# Patient Record
Sex: Female | Born: 1935 | Race: White | Hispanic: No | Smoking: Former smoker
Health system: Southern US, Community
[De-identification: ages and names within clinical notes are randomized; demographics above are authoritative.]

## PROBLEM LIST (undated history)

## (undated) DIAGNOSIS — R4182 Altered mental status, unspecified: Secondary | ICD-10-CM

## (undated) DIAGNOSIS — J440 Chronic obstructive pulmonary disease with acute lower respiratory infection: Secondary | ICD-10-CM

## (undated) DIAGNOSIS — M069 Rheumatoid arthritis, unspecified: Secondary | ICD-10-CM

## (undated) DIAGNOSIS — I5189 Other ill-defined heart diseases: Secondary | ICD-10-CM

## (undated) DIAGNOSIS — I2510 Atherosclerotic heart disease of native coronary artery without angina pectoris: Secondary | ICD-10-CM

## (undated) DIAGNOSIS — M810 Age-related osteoporosis without current pathological fracture: Secondary | ICD-10-CM

## (undated) DIAGNOSIS — J449 Chronic obstructive pulmonary disease, unspecified: Secondary | ICD-10-CM

## (undated) DIAGNOSIS — J209 Acute bronchitis, unspecified: Secondary | ICD-10-CM

## (undated) DIAGNOSIS — E559 Vitamin D deficiency, unspecified: Secondary | ICD-10-CM

## (undated) DIAGNOSIS — J439 Emphysema, unspecified: Secondary | ICD-10-CM

## (undated) DIAGNOSIS — E785 Hyperlipidemia, unspecified: Secondary | ICD-10-CM

## (undated) DIAGNOSIS — E039 Hypothyroidism, unspecified: Secondary | ICD-10-CM

## (undated) HISTORY — DX: Hyperlipidemia, unspecified: E78.5

## (undated) HISTORY — DX: Chronic obstructive pulmonary disease, unspecified: J44.9

## (undated) HISTORY — DX: Other ill-defined heart diseases: I51.89

## (undated) HISTORY — DX: Vitamin D deficiency, unspecified: E55.9

## (undated) HISTORY — DX: Atherosclerotic heart disease of native coronary artery without angina pectoris: I25.10

## (undated) HISTORY — DX: Altered mental status, unspecified: R41.82

## (undated) HISTORY — DX: Rheumatoid arthritis, unspecified: M06.9

## (undated) HISTORY — DX: Age-related osteoporosis without current pathological fracture: M81.0

## (undated) HISTORY — DX: Hypothyroidism, unspecified: E03.9

---

## 1984-06-21 HISTORY — PX: ABDOMINAL HYSTERECTOMY: SHX81

## 1999-07-23 ENCOUNTER — Encounter: Admission: RE | Admit: 1999-07-23 | Discharge: 1999-07-23 | Payer: Self-pay | Admitting: Family Medicine

## 1999-07-23 ENCOUNTER — Encounter: Payer: Self-pay | Admitting: Family Medicine

## 2000-08-25 ENCOUNTER — Encounter: Payer: Self-pay | Admitting: Family Medicine

## 2000-08-25 ENCOUNTER — Encounter: Admission: RE | Admit: 2000-08-25 | Discharge: 2000-08-25 | Payer: Self-pay | Admitting: Family Medicine

## 2002-05-06 ENCOUNTER — Encounter: Payer: Self-pay | Admitting: Family Medicine

## 2002-05-06 ENCOUNTER — Encounter: Admission: RE | Admit: 2002-05-06 | Discharge: 2002-05-06 | Payer: Self-pay | Admitting: Family Medicine

## 2002-05-10 ENCOUNTER — Encounter: Admission: RE | Admit: 2002-05-10 | Discharge: 2002-05-10 | Payer: Self-pay | Admitting: Family Medicine

## 2002-05-10 ENCOUNTER — Encounter: Payer: Self-pay | Admitting: Family Medicine

## 2004-02-01 ENCOUNTER — Encounter: Admission: RE | Admit: 2004-02-01 | Discharge: 2004-02-01 | Payer: Self-pay | Admitting: Rheumatology

## 2004-06-01 ENCOUNTER — Emergency Department (HOSPITAL_COMMUNITY): Admission: EM | Admit: 2004-06-01 | Discharge: 2004-06-01 | Payer: Self-pay | Admitting: Emergency Medicine

## 2005-08-30 ENCOUNTER — Encounter: Admission: RE | Admit: 2005-08-30 | Discharge: 2005-08-30 | Payer: Self-pay | Admitting: Rheumatology

## 2005-09-16 ENCOUNTER — Encounter: Admission: RE | Admit: 2005-09-16 | Discharge: 2005-09-16 | Payer: Self-pay | Admitting: Rheumatology

## 2005-09-26 ENCOUNTER — Encounter: Admission: RE | Admit: 2005-09-26 | Discharge: 2005-09-26 | Payer: Self-pay | Admitting: Rheumatology

## 2006-01-01 ENCOUNTER — Encounter: Admission: RE | Admit: 2006-01-01 | Discharge: 2006-01-01 | Payer: Self-pay | Admitting: Thoracic Surgery

## 2006-07-08 ENCOUNTER — Encounter: Admission: RE | Admit: 2006-07-08 | Discharge: 2006-07-08 | Payer: Self-pay | Admitting: Thoracic Surgery

## 2006-07-10 ENCOUNTER — Ambulatory Visit: Payer: Self-pay | Admitting: Thoracic Surgery

## 2006-11-19 ENCOUNTER — Encounter (HOSPITAL_COMMUNITY): Admission: RE | Admit: 2006-11-19 | Discharge: 2007-01-21 | Payer: Self-pay | Admitting: Rheumatology

## 2007-06-22 ENCOUNTER — Encounter (HOSPITAL_COMMUNITY): Admission: RE | Admit: 2007-06-22 | Discharge: 2007-09-20 | Payer: Self-pay | Admitting: Rheumatology

## 2008-01-30 IMAGING — CR DG CHEST 2V
2 series · 2 of 2 positions shown · non-contrast
Comparison: [REDACTED] chest x-ray 06/01/04.

CLINICAL DATA: Cough, congestion, smoker. 
DIAGNOSTIC CHEST - 2 VIEW:

[w chest pa]
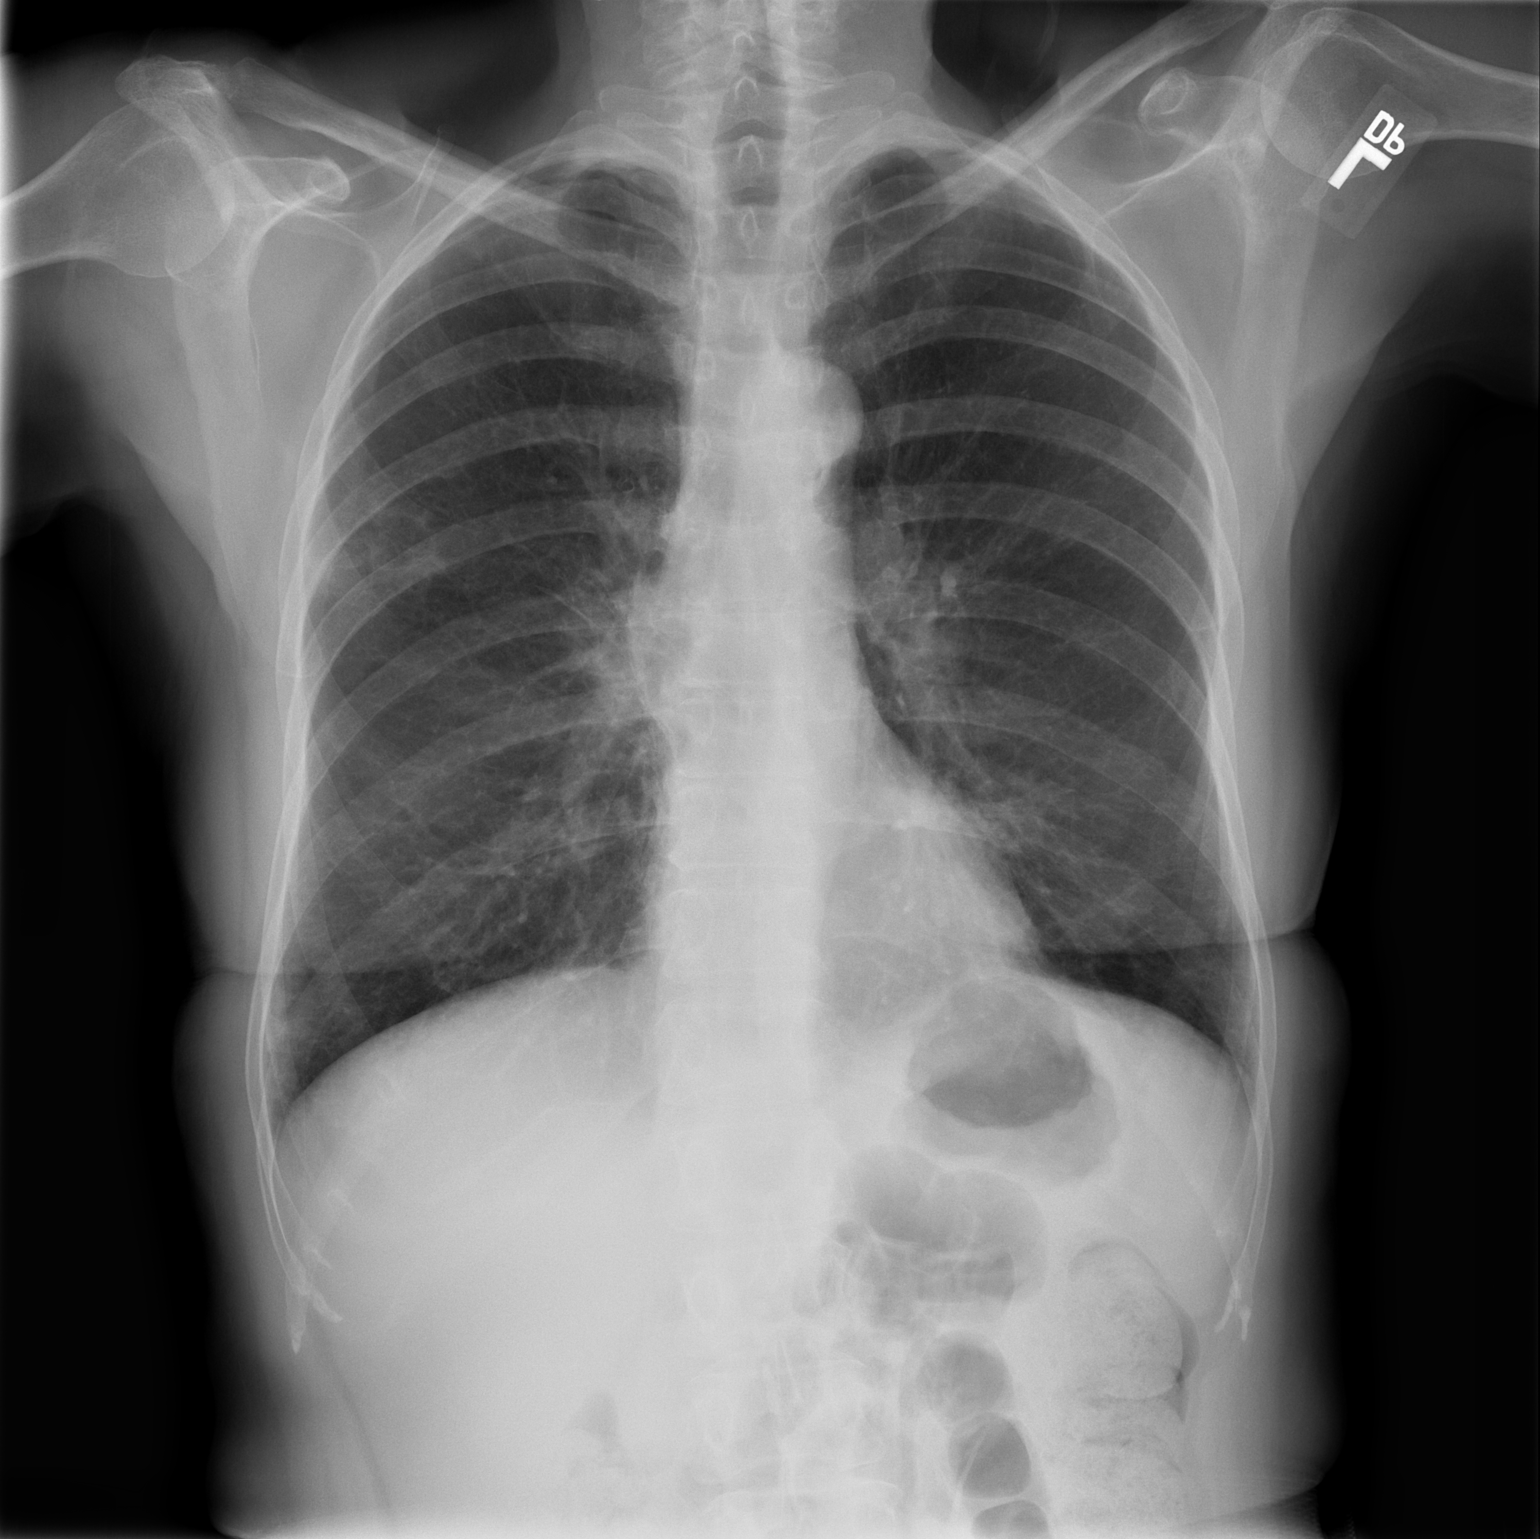

[w chest lat]
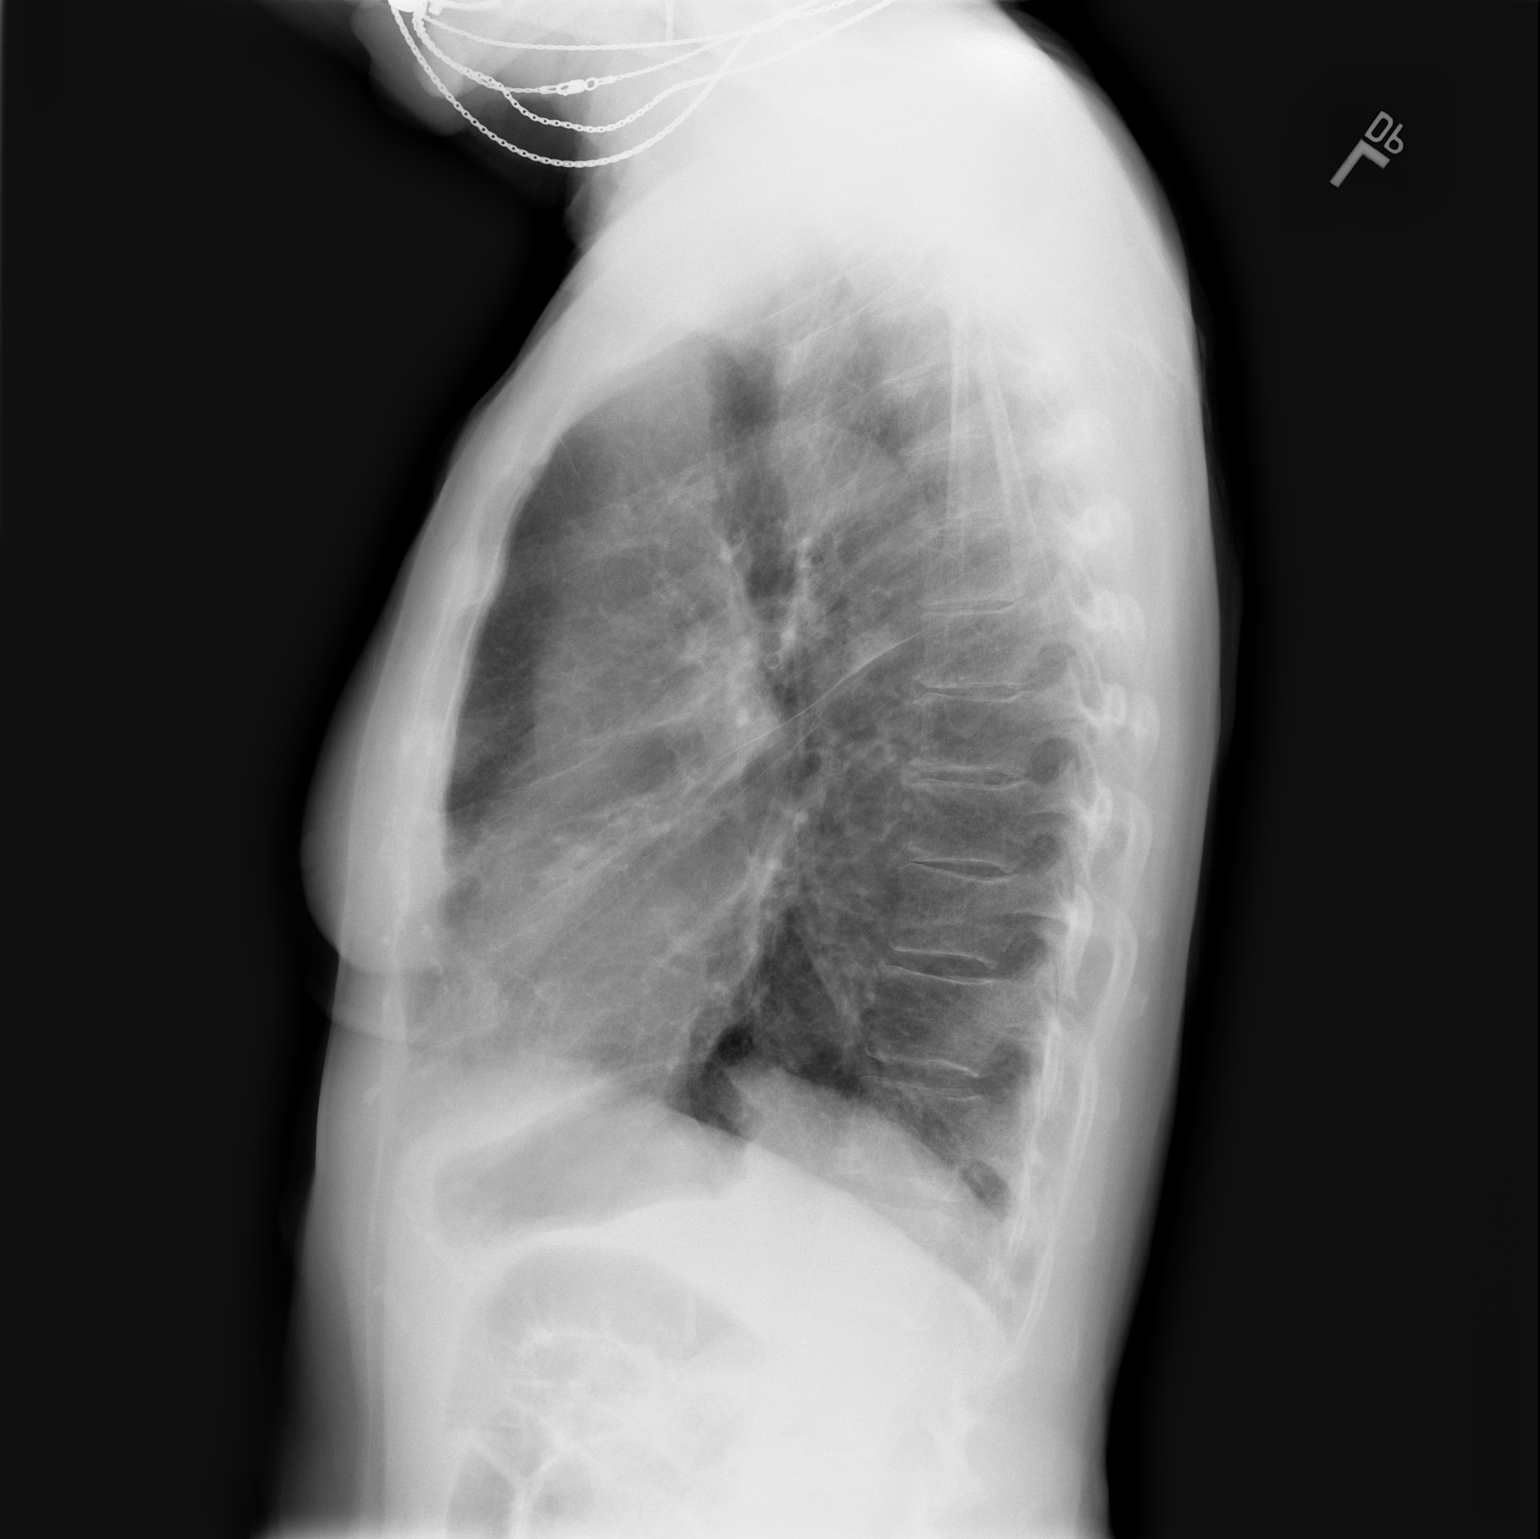

[2 of 2 positions shown; findings below may reference images not displayed]

FINDINGS: Since prior study patchy interstitial opacity is seen at the right upper lobe with progressive diffuse prominence of the interstitium. Either patchy interstitial infiltrate or slight fullness is seen at the lateral right hilum since prior study.  Followup chest x-ray to clearing and/or chest CT with intravenous contrast are advised for further evaluation of this finding.  Remainder of study is stable with normal heart size and the lungs otherwise clear.
IMPRESSION: 1.  Diffuse bronchitis with minimal patchy interstitial pneumonitis right upper lobe. 
2.  Either faint perihilar interstitial infiltrate versus fullness right hilum since prior study ? recommend followup chest x-ray to clearing and/or chest CT for further evaluation. 
3.  Otherwise no significant abnormality.

## 2009-10-11 ENCOUNTER — Encounter: Admission: RE | Admit: 2009-10-11 | Discharge: 2009-10-11 | Payer: Self-pay | Admitting: Family Medicine

## 2010-02-11 ENCOUNTER — Encounter: Payer: Self-pay | Admitting: Thoracic Surgery

## 2010-02-11 ENCOUNTER — Encounter: Payer: Self-pay | Admitting: Rheumatology

## 2010-06-05 NOTE — Letter (Signed)
July 10, 2006   Donia Guiles, M.D.  [address] [place], [place] [address]   Re:  LAYAL, JAVID                DOB:   Dear Dr. Arvilla Market:   I saw the patient back today and her CT scan showed clearing of all of  her air space disease, and just some scarring in the left lingula.  For  this reason, I feel that this was all inflammatory secondary to smoking.  She, fortunately, now has quit smoking and hopefully her lung status  will stabilize. Because of her long history of smoking, I do recommend  that she get at least a chest x-ray once a year. I will be happy to see  her back if she has any further pulmonary problems.   Ines Bloomer, M.D.  Electronically Signed   DPB/MEDQ  D:  07/10/2006  T:  07/11/2006  Job:  [id number]

## 2011-05-03 ENCOUNTER — Other Ambulatory Visit: Payer: Self-pay | Admitting: Advanced Practice Midwife

## 2011-05-03 ENCOUNTER — Ambulatory Visit
Admission: RE | Admit: 2011-05-03 | Discharge: 2011-05-03 | Disposition: A | Discharge status: Home / Self Care | Payer: BC Managed Care – PPO | Source: Ambulatory Visit | Attending: Family Medicine | Admitting: Family Medicine

## 2011-05-03 ENCOUNTER — Other Ambulatory Visit: Payer: Self-pay | Admitting: Family Medicine

## 2011-05-03 DIAGNOSIS — R0600 Dyspnea, unspecified: Secondary | ICD-10-CM

## 2012-02-07 ENCOUNTER — Institutional Professional Consult (permissible substitution): Payer: BC Managed Care – PPO | Admitting: Internal Medicine

## 2012-02-25 ENCOUNTER — Encounter: Payer: Self-pay | Admitting: Internal Medicine

## 2012-02-26 ENCOUNTER — Ambulatory Visit (INDEPENDENT_AMBULATORY_CARE_PROVIDER_SITE_OTHER): Payer: Medicare PPO | Admitting: Internal Medicine

## 2012-02-26 ENCOUNTER — Encounter: Payer: Self-pay | Admitting: Internal Medicine

## 2012-02-26 ENCOUNTER — Ambulatory Visit (INDEPENDENT_AMBULATORY_CARE_PROVIDER_SITE_OTHER)
Admission: RE | Admit: 2012-02-26 | Discharge: 2012-02-26 | Disposition: A | Discharge status: Home / Self Care | Payer: Medicare PPO | Source: Ambulatory Visit | Attending: Internal Medicine | Admitting: Internal Medicine

## 2012-02-26 VITALS — BP 110/72 | HR 65 | Temp 97.8°F | Ht 65.0 in | Wt 171.6 lb

## 2012-02-26 DIAGNOSIS — J8410 Pulmonary fibrosis, unspecified: Secondary | ICD-10-CM

## 2012-02-26 NOTE — Assessment & Plan Note (Addendum)
-   02/26/2012  Walked RA x 3 laps @ 185 ft each stopped due to end of study, no desat     DDx for pulmonary fibrosis  includes idiopathic pulmonary fibrosis, pulmonary fibrosis associated with rheumatologic diseases (like RA, which she apparently has but which have a relatively benign course in most cases) , adverse effect from  drugs such as chemotherapy (like mtx, which she is taking chronically)  or amiodarone exposure, nonspecific interstitial pneumonia which is typically steroid responsive, and chronic hypersensitivity pneumonitis.   In active  smokers Langerhan's Cell  Histiocyctosis (eosinophilic granuomatosis),  DIP,  and Respiratory Bronchiolitis ILD also need to be considered,    In her case the main concern would be mild RA lung or MTX toxicity but neither appears relevant at present but clearly needs f/u with perhaps noct oximetry on RA if there is a noct concern.  See instructions for specific recommendations which were reviewed directly with the patient who was given a copy with highlighter outlining the key components.

## 2012-02-26 NOTE — Progress Notes (Signed)
  Subjective:    Patient ID: Tina Bishop, female    DOB: April 29, 1932  MRN: [id number]  HPI  77 yowf quit smoking 2008 p dx slt emphysema with no chronic symptoms then referred 02/26/2012 to pulmonary clinic by Dr Philipp Deputy for copd eval   02/26/2012 1st pulmonary ov/ Clive Parcel on advair x 2 years denies any limting sob with desired activities but grand daughter [name] wanted her eval for sob as apparently had a spell with some confusion sev weeks prior to OV  But pt denies any knowledge of this and drove herself to office s fm members to corroborate the hx.  Has RA on mtx x years with ? PF on cxr 05/03/11.  No obvious daytime variabilty or assoc chronic cough or cp or chest tightness, subjective wheeze overt sinus or hb symptoms. No unusual exp hx or h/o childhood pna/ asthma or premature birth to her knowledge.   Pt reports Sleeping ok without nocturnal  or early am exacerbation  of respiratory  c/o's or need for noct saba. Also denies any obvious fluctuation of symptoms with weather or environmental changes or other aggravating or alleviating factors except as outlined above   Review of Systems  Constitutional: Negative for fever and unexpected weight change.  HENT: Positive for congestion and rhinorrhea. Negative for ear pain, nosebleeds, sore throat, sneezing, trouble swallowing, dental problem, postnasal drip and sinus pressure.   Eyes: Negative for redness and itching.  Respiratory: Negative for cough, chest tightness, shortness of breath and wheezing.   Cardiovascular: Negative for palpitations and leg swelling.  Gastrointestinal: Negative for nausea and vomiting.  Genitourinary: Negative for dysuria.  Musculoskeletal: Negative for joint swelling.  Skin: Positive for rash.  Neurological: Negative for headaches.  Hematological: Does not bruise/bleed easily.  Psychiatric/Behavioral: Negative for dysphoric mood. The patient is not nervous/anxious.        Objective:   Physical Exam Wt  Readings from Last 3 Encounters:  02/26/12 171 lb 9.6 oz (77.837 kg)   HEENT:full set of dentures, nl turbinates, and orophanx. Nl external ear canals without cough reflex   NECK :  without JVD/Nodes/TM/ nl carotid upstrokes bilaterally   LUNGS: no acc muscle use, clear to A and P bilaterally without cough on insp or exp maneuvers - no significant crackles appreciated nor broncho-alveolar changes   CV:  RRR  no s3 or murmur or increase in P2, no edema   ABD:  soft and nontender with nl excursion in the supine position. No bruits or organomegaly, bowel sounds nl  MS:  warm without deformities, calf tenderness, cyanosis or clubbing  SKIN: warm and dry without lesions    NEURO:  alert, approp, no deficits    cxr 05/03/11  Prominent interstitial markings at the lung bases. Possible  progressive pulmonary fibrosis with interstitial edema less likely.  Mild peribronchial thickening consistent with bronchitis as well   CXR  02/26/2012 :  1. Chronic change with hyperaeration and foci of linear scarring. 2. Nodular opacity anteriorly on the lateral view of the retrosternal air space. This is not definitely seen previously and CT of the chest may be helpful to assess further if warranted.           Assessment & Plan:

## 2012-02-26 NOTE — Patient Instructions (Addendum)
Try off advair (I already understand you're not using it consistently anyway)  Please remember to go to the  x-ray department downstairs for your tests - we will call you with the results when they are available.     Please schedule a follow up office visit in 6 weeks, call sooner if needed with PFTs and bring any family members with you who are concerned about your breathing so we can all talk together about your problem

## 2012-04-08 ENCOUNTER — Ambulatory Visit: Payer: Medicare PPO | Admitting: Internal Medicine

## 2012-05-13 ENCOUNTER — Encounter: Payer: Self-pay | Admitting: Internal Medicine

## 2012-05-13 ENCOUNTER — Ambulatory Visit (INDEPENDENT_AMBULATORY_CARE_PROVIDER_SITE_OTHER): Payer: Medicare PPO | Admitting: Internal Medicine

## 2012-05-13 VITALS — BP 106/60 | HR 71 | Temp 97.0°F | Ht 62.0 in | Wt 162.0 lb

## 2012-05-13 DIAGNOSIS — J8410 Pulmonary fibrosis, unspecified: Secondary | ICD-10-CM

## 2012-05-13 LAB — PULMONARY FUNCTION TEST

## 2012-05-13 NOTE — Progress Notes (Signed)
Subjective:    Patient ID: Tina Bishop, female    DOB: 12-18-1932  MRN: [id number]  HPI  76 yowf quit smoking 2008 p dx slt emphysema with no chronic symptoms then referred 02/26/2012 to pulmonary clinic by Dr Philipp Deputy for copd eval   02/26/2012 1st pulmonary ov/ Wert on advair x 2 years denies any limting sob with desired activities but grand daughter Tina Bishop wanted her eval for sob as apparently had a spell with some confusion sev weeks prior to OV  But pt denies any knowledge of this and drove herself to office s fm members to corroborate the hx.  Has RA on mtx x years with ? PF on cxr 05/03/11.  05/13/2012 f/u ov/Wert re ? COPD / pf Chief Complaint  Patient presents with  . Follow-up    Pt states breathing is doing well, her nasal congestion is better since the last visit.    able to yard work, not limited at all with sob or using any resp meds  No obvious daytime variabilty or assoc chronic cough or cp or chest tightness, subjective wheeze overt sinus or hb symptoms. No unusual exp hx or h/o childhood pna/ asthma or premature birth to her knowledge.   Pt reports Sleeping ok without nocturnal  or early am exacerbation  of respiratory  c/o's or need for noct saba. Also denies any obvious fluctuation of symptoms with weather or environmental changes or other aggravating or alleviating factors except as outlined above   Current Medications, Allergies, Past Medical History, Past Surgical History, Family History, and Social History were reviewed in [employer] record.  ROS  The following are not active complaints unless bolded sore throat, dysphagia, dental problems, itching, sneezing,  nasal congestion or excess/ purulent secretions, ear ache,   fever, chills, sweats, unintended wt loss, pleuritic or exertional cp, hemoptysis,  orthopnea pnd or leg swelling, presyncope, palpitations, heartburn, abdominal pain, anorexia, nausea, vomiting, diarrhea  or change in bowel  or urinary habits, change in stools or urine, dysuria,hematuria,  rash, arthralgias, visual complaints, headache, numbness weakness or ataxia or problems with walking or coordination,  change in mood/affect or memory.            Objective:   Physical Exam  Wt Readings from Last 3 Encounters:  05/13/12 162 lb (73.483 kg)  02/26/12 171 lb 9.6 oz (77.837 kg)     HEENT:full set of dentures, nl turbinates, and orophanx. Nl external ear canals without cough reflex   NECK :  without JVD/Nodes/TM/ nl carotid upstrokes bilaterally   LUNGS: no acc muscle use, clear to A and P bilaterally without cough on insp or exp maneuvers - no significant crackles appreciated nor broncho-alveolar changes   CV:  RRR  no s3 or murmur or increase in P2, no edema   ABD:  soft and nontender with nl excursion in the supine position. No bruits or organomegaly, bowel sounds nl  MS:  warm without deformities, calf tenderness, cyanosis or clubbing       cxr 05/03/11  Prominent interstitial markings at the lung bases. Possible  progressive pulmonary fibrosis with interstitial edema less likely.  Mild peribronchial thickening consistent with bronchitis as well   CXR  02/26/2012 :  1. Chronic change with hyperaeration and foci of linear scarring. 2. Nodular opacity anteriorly on the lateral view of the retrosternal air space. This is not definitely seen previously and CT of the chest may be helpful to assess further if warranted.  Assessment & Plan:

## 2012-05-13 NOTE — Patient Instructions (Addendum)
You do not have sigificant lung disease from smoking and therefore it is very unlikely you ever will as long as you don't smoke  Your rheumatism and use of methotrexate may cause lung problems over time but as long as you do not notice decrease exercise tolerance you do not need follow up here unless your rheumatologist wants you screened for this (yearly at most is all I would recommend if no symptoms)

## 2012-05-13 NOTE — Assessment & Plan Note (Signed)
-   02/26/2012  Walked RA x 3 laps @ 185 ft each stopped due to end of study, no desat   - 05/13/2012 PFT's VC 2.35 and no obst and DLCO 47 corrects to 94%  No significant copd or ILD though she is at risk of the latter from RA and MTX.  I had an extended summary discussion with the patient today lasting 15 to 20 minutes of a 25 minute visit on the following issues:   Does not have sign copd so likely never will (today's pft's reviewed with her)  Risk of ILD so reasonable to do walking sats and pft's yearly but we can certainly see her sooner if needed for resp symptoms

## 2012-05-13 NOTE — Progress Notes (Signed)
PFT done today. 

## 2012-06-05 ENCOUNTER — Encounter: Payer: Self-pay | Admitting: Internal Medicine

## 2012-08-03 ENCOUNTER — Ambulatory Visit: Payer: Medicare PPO | Admitting: Neurology

## 2012-08-10 ENCOUNTER — Ambulatory Visit (INDEPENDENT_AMBULATORY_CARE_PROVIDER_SITE_OTHER): Payer: Medicare PPO | Admitting: Neurology

## 2012-08-10 ENCOUNTER — Encounter: Payer: Self-pay | Admitting: Neurology

## 2012-08-10 VITALS — BP 130/71 | HR 75 | Ht 63.0 in | Wt 164.0 lb

## 2012-08-10 DIAGNOSIS — G3184 Mild cognitive impairment, so stated: Secondary | ICD-10-CM

## 2012-08-10 DIAGNOSIS — R569 Unspecified convulsions: Secondary | ICD-10-CM | POA: Insufficient documentation

## 2012-08-10 NOTE — Patient Instructions (Addendum)
Overall you are doing fairly well but I do want to suggest a few things today:   As far as your medications are concerned, I would like to suggest holding off on re-starting you on any anti-epileptic medications pending the results of your EEG. You do not wish to be on any medication for your memory and we can hold off on starting treatment for that at this time.  As far as diagnostic testing: We will order a routine EEG and will contact you to schedule this.  I would like to see you back in 3 to 4 months, sooner if we need to. Please call us with any interim questions, concerns, problems, updates or refill requests.   Please also call us for any test results so we can go over those with you on the phone.  My clinical assistant and will answer any of your questions and relay your messages to me and also relay most of my messages to you.   Our phone number is [phone]. We also have an after hours call service for urgent matters and there is a physician on-call for urgent questions. For any emergencies you know to call 911 or go to the nearest emergency room

## 2012-08-10 NOTE — Progress Notes (Signed)
HPI:  Tina Bishop is a 80y/o woman presenting for initial evaluation of possible seizure disorder and cognitive decline. Has been followed by Dr Okey Dupre (neurologist) at [facility] but requested a new neurologist. Has not seen her in over 1.53yrs. Patient currently states she is doing well and reports she is doing well. States she has not had any seizures in the past. Is difficult to get accurate history from patient.  Onset of Episodes: Reports having had 2 episodes. First one was 2.5years ago. She reports feeling disoriented/confused, per patient her family reports she was not her self, she did not recognize them, she reports no abnormal movements, no facial automatisms. She thinks it lasted a few minutes. She attributes this to a UTI. 8 months later had a 2nd episode where she states she was unable to walk, was not confused during this episode. States her oxygen was low during this episode.She was started on keppra 500mg  bid after the first episode. She stopped the keppra 2 months ago after discussion with Dr Clelia Croft. No hx of seizures in the past. No history of head trauma. No headaches. No hx of meningitis or encephalitis. No EtOH use. Continues to drive.    Imaging/EEG: Reports having brain imaging in the past but unsure what it showed but reports it was normal.  Memory decline: Lives alone, states memory is good. Takes care of her finances, no difficulties. Was started on Namenda at the same time as keppra, recently stopped namenda when she stopped keppra. She feels her memory is better off of the Namenda. No hallucinations. Sleeps well, no RBD. Remains active.   Last EEG completed on 04/2009 which showed "mild slow wave abnormality in both frontal and temporal regions indicating a slight neurophysiologic disturbance w/in both frontal and temporal areas, R>L"   ROS: Constitutional: Denies fever, +weight gain Eyes: Denies blurry vision, loss of vision, eye pain CV: Denies chest pain,  palpitations, syncope Pulm: Denies SOB, dyspnea, cough GI:  Denies constipation, diarrhea, abdominal pain MSK: Denies spasms, muscle pain, weakness Neuro: Denies HA, vertigo, falls, tremor Psyc:  Denies depression, hallucinatons, confusion Hem/lymph: Denies easy bleeding, bruising, no swollen nodes Allergic: No runny nose, hives, rashes  All other ROS are negative      Exam: Gen: NAD, pleasant CV: RRR no m/r/g Pulm: CTA bilat Abd: +BS, soft, NT, ND  Neuro: Tina: alert, oriented to name, 08/10/2012, building and city. Follows simple and mulitstep commands. Tells POTUS x 1, forgets GW Bush but remembers [place] and [employer]. Difficulty with serial 7s(gets 93 and 86 but nothing further. Calculates 6 quarters in 1.50. 3/3 immediate and 2/3 recall at 5 minutes. CN: PERRL, EOMI no nystagmus, no ptosis, sensation intact to LT V1-V3 bilat, face symmetric, no weakness, hearing grossly intact, palate elevates symmetrically, shoulder shrug 5/5 bilat,  tongue protrudes midline, no fasiculations noted.  Motor: normal bulk and tone Strength: 5/5  In all extremities  Coord: rapid alternating and point-to-point (FNF, HTS) movements intact.  Reflexes: symmetrical, bilat downgoing toes  Sens: LT intact in all extremities  Gait: posture, stance, stride and arm-swing normal. Tandem gait intact. Able to walk on heels and toes. Romberg absent.    Assessment/Plan:  To Dr. Clelia Croft,  Thank you for the opportunity to take part in the care of her patient Tina. Tina Bishop. As you know Tina. Pavelko is a pleasant 79 year old woman who reports for initial evaluation of questionable seizure episodes and cognitive decline. She reports to clinic today without family and states  she is unsure why she is here. Patientt is overall poor historian. Per chart review this event initially current 2008. Had normal brain MRI at that time and an EEG showing nonspecific frontal temporal slowing. Patient continues to  drive and recently stopped Keppra and Namenda with no further episodes. Per the records the episode lasted around 2 hours and involved confusion disorientation with loss of bladder control. No loss of consciousness or generalized tonic-clonic movements. No lip smacking or eye  closure noted. Per records the patient was noted by her family to be talking gibberish during this event.  1) Seizure disorder  Per records and history these episodes seem concerning for seizure disorder. EEG was nonspecific but does show possible areas of foci. Patient does continue to drive and is now taking Keppra at this time. As patient appears to have had multiple events she is at a higher risk of having further seizure episodes.   -will recheck EEG to compare to prior studies -patient states she does not wish to start back on any AEDs. As it has been >32yrs (per patient) since an event it is prudent to hold off on re-starting AED pending repeat EEG. Based on results would then make decision on continued need for AED -patient counseled on driving risk, states she has follow up with [facility] scheduled for later this summer, counseled to avoid swimming alone   2)Mild cognitive impairment  Based on basic mental status exam patient appears to have some cognitive decline but overall appears to be functioning well. At this time she is adamant that she does not have any cognitive issues and does not wish to proceed with further testing. Without further testing cannot determine underlying etiology at this point.   -if willing, in the future would check B12, TSH and MOCA or MMSE at next office visit -patient unwilling to start any cognitive enhancing medication at this time. Will discuss further at next visit.  Follow up in 3 to 4 months, once EEG is complete  A total of 60 minutes was spent in with this patient. Over half this time was spent on counseling patient on the diagnosis and different therapeutic options available.

## 2012-08-19 ENCOUNTER — Ambulatory Visit (INDEPENDENT_AMBULATORY_CARE_PROVIDER_SITE_OTHER): Payer: Medicare PPO | Admitting: Radiology

## 2012-08-19 ENCOUNTER — Encounter: Payer: Self-pay | Admitting: Neurology

## 2012-08-19 DIAGNOSIS — R569 Unspecified convulsions: Secondary | ICD-10-CM

## 2012-08-19 NOTE — Procedures (Signed)
  History:  Tina Bishop is a 79 year old patient with a history of a possible seizure disorder and a history of cognitive decline. The patient has had episodes of disorientation and confusion. The patient being evaluated for these episodes.  This is a sleep deprived EEG. No skull defects are noted. Medications include aspirin, calcium, vitamin D, folic acid, Keppra, Namenda, methotrexate, pravastatin, and Valtrex.   EEG classification: Normal awake and drowsy  Description of the recording: The background rhythms of this recording consists of a fairly well modulated medium amplitude alpha rhythm of 11 Hz that is reactive to eye opening and closure. As the record progresses, the patient appears to remain in the waking state throughout the recording. Photic stimulation was performed, resulting in a bilateral and symmetric photic driving response. Hyperventilation was not performed. Toward the end of the recording, the patient enters the drowsy state with slight symmetric slowing seen. The patient never enters stage II sleep. At no time during the recording does there appear to be evidence of spike or spike wave discharges or evidence of focal slowing. EKG monitor shows no evidence of cardiac rhythm abnormalities with a heart rate of 66.  Impression: This is a normal sleep deprived EEG recording in the waking and drowsy state. No evidence of ictal or interictal discharges are seen.

## 2012-08-26 ENCOUNTER — Encounter: Payer: Self-pay | Admitting: Neurology

## 2012-09-02 ENCOUNTER — Telehealth: Payer: Self-pay | Admitting: Neurology

## 2012-11-10 ENCOUNTER — Encounter (INDEPENDENT_AMBULATORY_CARE_PROVIDER_SITE_OTHER): Payer: Self-pay

## 2012-11-10 ENCOUNTER — Ambulatory Visit (INDEPENDENT_AMBULATORY_CARE_PROVIDER_SITE_OTHER): Payer: Medicare PPO | Admitting: Neurology

## 2012-11-10 ENCOUNTER — Encounter: Payer: Self-pay | Admitting: Neurology

## 2012-11-10 VITALS — BP 123/68 | HR 75 | Ht 63.5 in | Wt 170.0 lb

## 2012-11-10 DIAGNOSIS — R569 Unspecified convulsions: Secondary | ICD-10-CM

## 2012-11-10 NOTE — Patient Instructions (Addendum)
Overall you are doing fairly well but I do want to suggest a few things today:   Remember to drink plenty of fluid, eat healthy meals and do not skip any meals. Try to eat protein with a every meal and eat a healthy snack such as fruit or nuts in between meals. Try to keep a regular sleep-wake schedule and try to exercise daily, particularly in the form of walking, 20-30 minutes a day, if you can.   As far as your medications are concerned, I do not think you need to be on any seizure medications at this time.   Follow up as needed. Please call us with any interim questions, concerns, problems, updates or refill requests.   My clinical assistant and will answer any of your questions and relay your messages to me and also relay most of my messages to you.   Our phone number is [phone]. We also have an after hours call service for urgent matters and there is a physician on-call for urgent questions. For any emergencies you know to call 911 or go to the nearest emergency room

## 2012-11-10 NOTE — Progress Notes (Signed)
[facility]   Provider:  Dr Hosie Poisson Referring Provider: Lupita Raider, MD Primary Care Physician:  Lupita Raider, MD  CC:  Seizure disorder  HPI:  Tina Bishop is a 79 y.o. female here as a follow up with last visit on 07/2012. Initial evaluation was for possible seizure disorder and cognitive decline. Since last visit has had an EEG which was unremarkable. Reports no further seizure episodes. Currently on no AED. Denies any cognitive issues. Reports doing well overall with no concerns.   Concerns/Questions:Review of Systems: Out of a complete 14 system review, the patient complains of only the following symptoms, and all other reviewed systems are negative. Denies any review of systems  History   Social History  . Marital Status: Widowed    Spouse Name: N/A    Number of Children: N/A  . Years of Education: N/A   Occupational History  .      retired    Social History Main Topics  . Smoking status: Former Smoker -- 1.00 packs/day for 60 years    Types: Cigarettes    Start date: 02/26/2007  . Smokeless tobacco: Never Used  . Alcohol Use: No  . Drug Use: No  . Sexual Activity: Not on file   Other Topics Concern  . Not on file   Social History Narrative  . No narrative on file    Family History  Problem Relation Age of Onset  . Cancer Brother   . Heart failure Father     Past Medical History  Diagnosis Date  . Altered mental status   . Rheumatoid arthritis(714.0)   . Hypothyroidism   . Vitamin D deficiency   . Osteoporosis   . COPD (chronic obstructive pulmonary disease)   . Diastolic dysfunction   . CAD (coronary artery disease)   . Hyperlipidemia     Past Surgical History  Procedure Laterality Date  . Abdominal hysterectomy  06-1984    Current Outpatient Prescriptions  Medication Sig Dispense Refill  . acetaminophen-codeine (TYLENOL #3) 300-30 MG per tablet Take 300 tablets by mouth as needed.      Marland Kitchen aspirin 81 MG tablet  Take 81 mg by mouth daily.      Marland Kitchen CALCIUM PO Take 1 tablet by mouth daily.      . cholecalciferol (VITAMIN D) 1000 UNITS tablet Take 1,000 Units by mouth daily.      . clobetasol (TEMOVATE) 0.05 % external solution Apply topically 2 (two) times daily.       . folic acid (FOLVITE) 1 MG tablet Take 1 mg by mouth daily.      . hydroxychloroquine (PLAQUENIL) 200 MG tablet Take 200 mg by mouth daily.      Marland Kitchen ketorolac (ACULAR) 0.5 % ophthalmic solution Apply 0.5 drops to eye daily.      Marland Kitchen levETIRAcetam (KEPPRA XR) 500 MG 24 hr tablet Take 500 mg by mouth daily.      . methotrexate (RHEUMATREX) 2.5 MG tablet Take 2.5 mg by mouth once a week. Caution:Chemotherapy. Protect from light.      . nystatin cream (MYCOSTATIN) Apply 100,000 application topically daily.      . pravastatin (PRAVACHOL) 20 MG tablet Take 20 mg by mouth daily.      Marland Kitchen triamcinolone cream (KENALOG) 0.1 % Apply 1 application topically 2 (two) times daily.      . valACYclovir (VALTREX) 500 MG tablet Take 500 mg by mouth daily.      Marland Kitchen VIGAMOX 0.5 % ophthalmic solution Apply  0.5 drops to eye daily.       No current facility-administered medications for this visit.    Allergies as of 11/10/2012 - Review Complete 11/10/2012  Allergen Reaction Noted  . Sulfa antibiotics Rash 02/25/2012    Vitals: BP 123/68  Pulse 75  Ht 5' 3.5" (1.613 m)  Wt 170 lb (77.111 kg)  BMI 29.64 kg/m2 Last Weight:  Wt Readings from Last 1 Encounters:  11/10/12 170 lb (77.111 kg)   Last Height:   Ht Readings from Last 1 Encounters:  11/10/12 5' 3.5" (1.613 m)     Physical exam: Exam: Gen: NAD, pleasant  CV: RRR no m/r/g  Pulm: CTA bilat  Abd: +BS, soft, NT, ND  Neuro:  Tina: alert, oriented to name,date, building and city. Follows simple and mulitstep commands. Difficulty with serial 7s,  3/3 immediate and 2/3 recall at 5 minutes. CN:  PERRL, EOMI no nystagmus, no ptosis, sensation intact to LT V1-V3 bilat, face symmetric, no weakness, hearing  grossly intact, palate elevates symmetrically, shoulder shrug 5/5 bilat,  tongue protrudes midline, no fasiculations noted.  Motor: normal bulk and tone  Strength:  5/5 In all extremities  Coord: rapid alternating and point-to-point (FNF, HTS) movements intact.  Reflexes: symmetrical, bilat downgoing toes  Sens: LT intact in all extremities  Gait: posture, stance, stride and arm-swing normal. Tandem gait intact. Able to walk on heels and toes. Romberg absent.    Assessment:  After physical and neurologic examination, review of laboratory studies, imaging, neurophysiology testing and pre-existing records, assessment will be reviewed on the problem list.  Plan:  Treatment plan and additional workup will be reviewed under Problem List.  1)Seizure disorder 2)Cognitive decline  Tina Bishop is a pleasant 80y/o woman with history of seizure disorder presenting for follow up evaluation of seizure disorder and cognitive decline. Patient had an EEG since last appointment, this was unremarkable. Has no further seizure activity, currently is on no antiepileptic medication. Patient denies any concerns over cognitive function. Does not have any further testing at this time. Will follow up as needed.

## 2012-12-21 ENCOUNTER — Ambulatory Visit
Admission: RE | Admit: 2012-12-21 | Discharge: 2012-12-21 | Disposition: A | Discharge status: Home / Self Care | Payer: Medicare PPO | Source: Ambulatory Visit | Attending: Family Medicine | Admitting: Family Medicine

## 2012-12-21 ENCOUNTER — Other Ambulatory Visit: Payer: Self-pay | Admitting: Family Medicine

## 2012-12-21 DIAGNOSIS — J189 Pneumonia, unspecified organism: Secondary | ICD-10-CM

## 2013-01-12 ENCOUNTER — Ambulatory Visit
Admission: RE | Admit: 2013-01-12 | Discharge: 2013-01-12 | Disposition: A | Discharge status: Home / Self Care | Payer: Medicare PPO | Source: Ambulatory Visit | Attending: Family Medicine | Admitting: Family Medicine

## 2013-01-12 ENCOUNTER — Other Ambulatory Visit: Payer: Self-pay | Admitting: Family Medicine

## 2013-01-12 DIAGNOSIS — J189 Pneumonia, unspecified organism: Secondary | ICD-10-CM

## 2013-04-27 NOTE — Telephone Encounter (Signed)
Closing encounter

## 2014-07-28 IMAGING — CR DG CHEST 2V
2 series · 2 of 2 positions shown · non-contrast
Comparison: Chest x-ray of 05/03/2011

CLINICAL DATA: History of rheumatoid arthritis, pulmonary fibrosis,
former smoking history

CHEST - 2 VIEW

[view not recorded (1 of 2)]
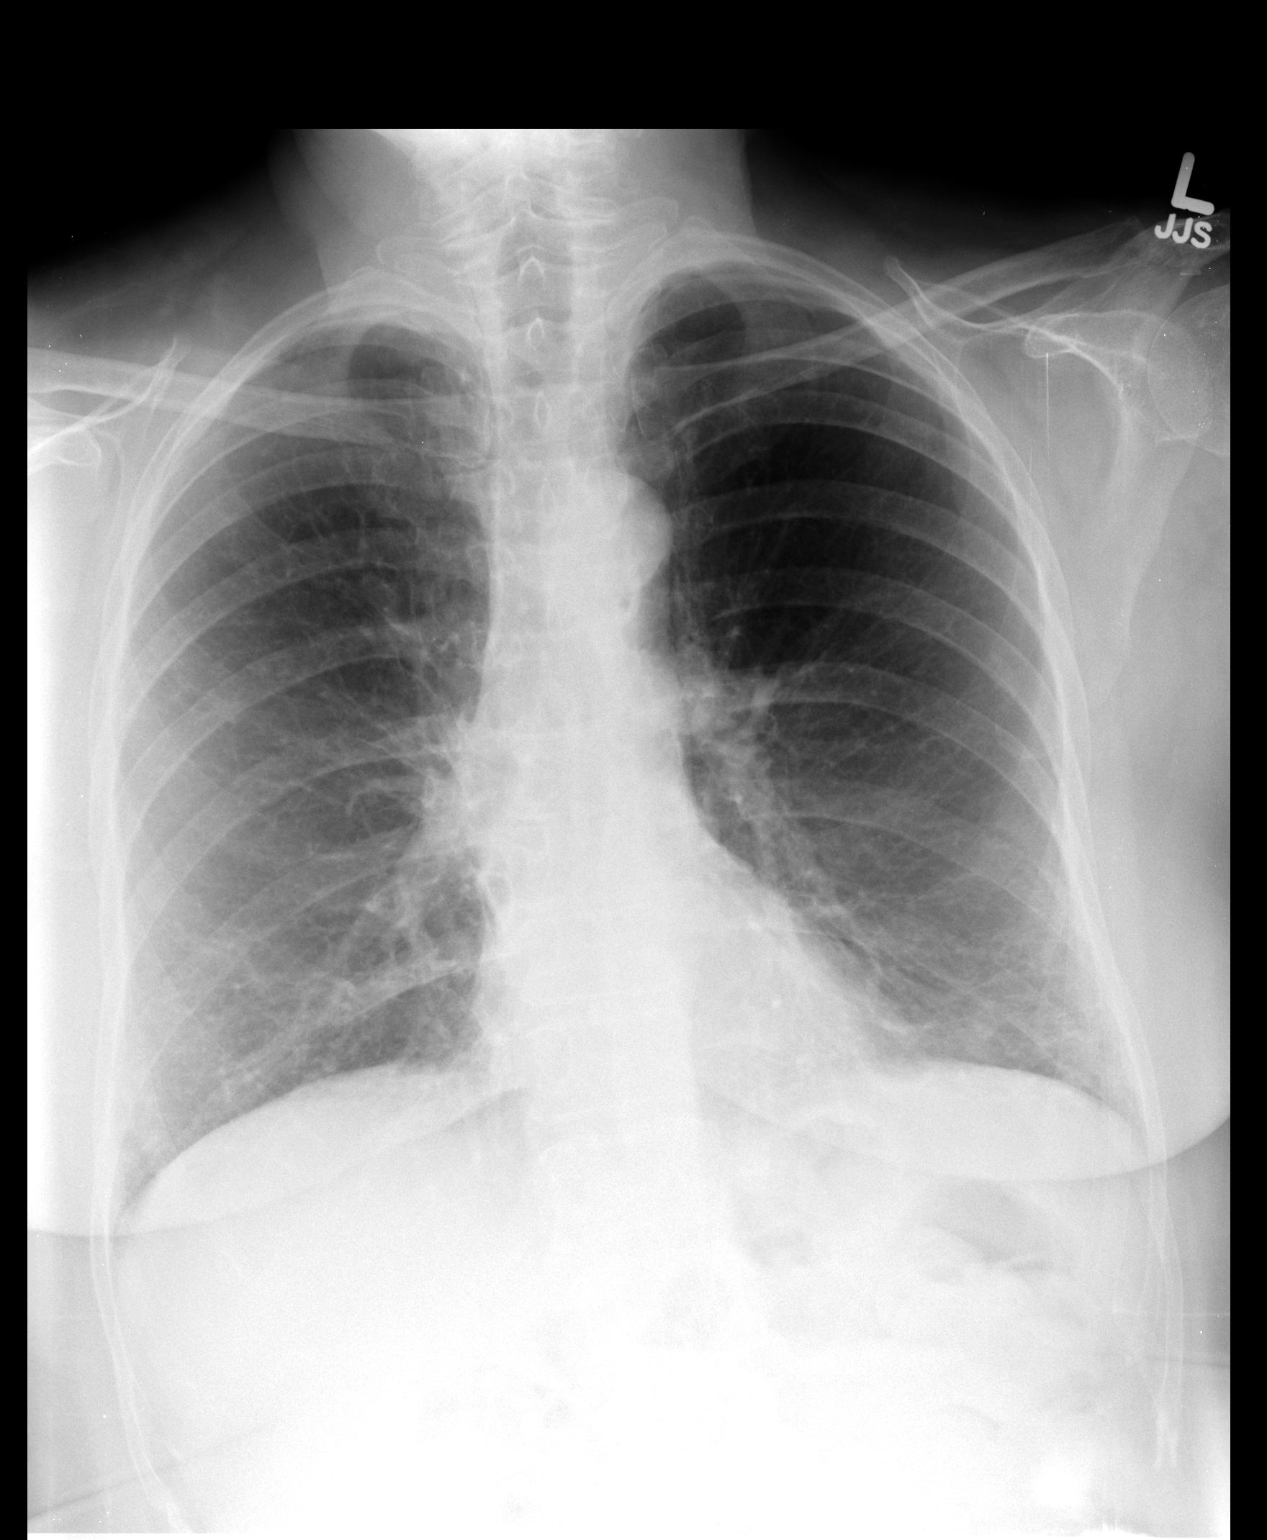

[view not recorded (2 of 2)]
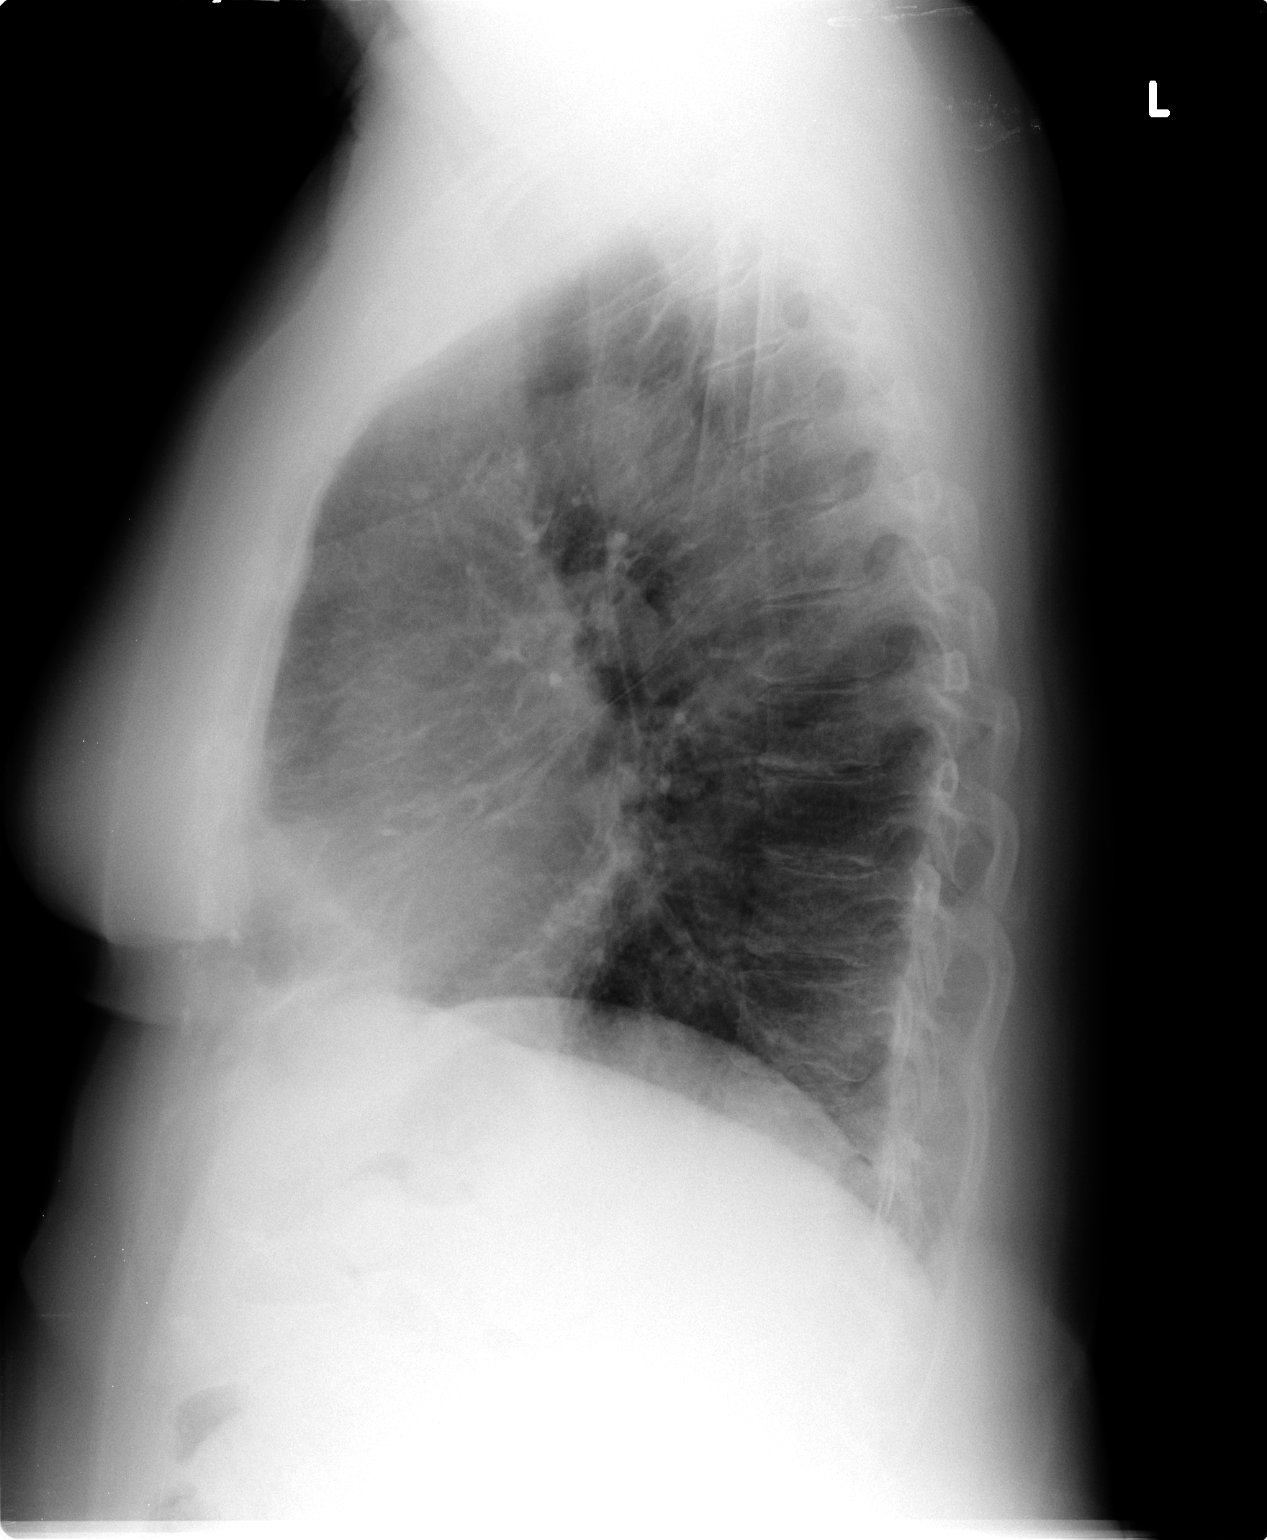

[2 of 2 positions shown; findings below may reference images not displayed]

FINDINGS: The lungs remain hyperaerated consistent with emphysema.
There is some peribronchial thickening indicative of chronic
bronchitis. Linear scarring at the lung bases is noted.  On the
lateral view there is a small nodular opacity in the retrosternal
air space.  This may have been present previously but is not as
well seen.  If further assessment is warranted CT of the chest
would be recommended.  Mediastinal contours are stable.  The heart
is within normal limits in size.  No acute skeletal abnormality is
seen.
IMPRESSION: 1.  Chronic change with hyperaeration and foci of linear scarring.
2.  Nodular opacity anteriorly on the lateral view of the
retrosternal air space.  This is not definitely seen previously and
CT of the chest may be helpful to assess further if warranted.

## 2016-01-14 ENCOUNTER — Encounter (HOSPITAL_BASED_OUTPATIENT_CLINIC_OR_DEPARTMENT_OTHER): Payer: Self-pay | Admitting: Emergency Medicine

## 2016-01-14 ENCOUNTER — Emergency Department (HOSPITAL_BASED_OUTPATIENT_CLINIC_OR_DEPARTMENT_OTHER): Payer: Medicare Other

## 2016-01-14 ENCOUNTER — Emergency Department (HOSPITAL_BASED_OUTPATIENT_CLINIC_OR_DEPARTMENT_OTHER)
Admission: EM | Admit: 2016-01-14 | Discharge: 2016-01-14 | Disposition: A | Discharge status: Home / Self Care | Payer: Medicare Other | Attending: Emergency Medicine | Admitting: Emergency Medicine

## 2016-01-14 DIAGNOSIS — Z79899 Other long term (current) drug therapy: Secondary | ICD-10-CM | POA: Diagnosis not present

## 2016-01-14 DIAGNOSIS — R05 Cough: Secondary | ICD-10-CM | POA: Diagnosis present

## 2016-01-14 DIAGNOSIS — E039 Hypothyroidism, unspecified: Secondary | ICD-10-CM | POA: Diagnosis not present

## 2016-01-14 DIAGNOSIS — I2510 Atherosclerotic heart disease of native coronary artery without angina pectoris: Secondary | ICD-10-CM | POA: Diagnosis not present

## 2016-01-14 DIAGNOSIS — J441 Chronic obstructive pulmonary disease with (acute) exacerbation: Secondary | ICD-10-CM | POA: Diagnosis not present

## 2016-01-14 DIAGNOSIS — R4182 Altered mental status, unspecified: Secondary | ICD-10-CM | POA: Diagnosis not present

## 2016-01-14 DIAGNOSIS — R059 Cough, unspecified: Secondary | ICD-10-CM

## 2016-01-14 DIAGNOSIS — R6889 Other general symptoms and signs: Secondary | ICD-10-CM

## 2016-01-14 DIAGNOSIS — Z87891 Personal history of nicotine dependence: Secondary | ICD-10-CM | POA: Insufficient documentation

## 2016-01-14 HISTORY — DX: Emphysema, unspecified: J43.9

## 2016-01-14 HISTORY — DX: Acute bronchitis, unspecified: J20.9

## 2016-01-14 HISTORY — DX: Chronic obstructive pulmonary disease with (acute) lower respiratory infection: J44.0

## 2016-01-14 LAB — CBC WITH DIFFERENTIAL/PLATELET
BASOS ABS: 0 10*3/uL (ref 0.0–0.1)
Basophils Relative: 0 %
EOS PCT: 1 %
Eosinophils Absolute: 0.2 10*3/uL (ref 0.0–0.7)
HCT: 38.3 % (ref 36.0–46.0)
HEMOGLOBIN: 12.5 g/dL (ref 12.0–15.0)
Lymphocytes Relative: 11 %
Lymphs Abs: 2.3 10*3/uL (ref 0.7–4.0)
MCH: 28.3 pg (ref 26.0–34.0)
MCHC: 32.6 g/dL (ref 30.0–36.0)
MCV: 86.7 fL (ref 78.0–100.0)
MONO ABS: 1.6 10*3/uL — AB (ref 0.1–1.0)
Monocytes Relative: 8 %
NEUTROS PCT: 80 %
Neutro Abs: 16.5 10*3/uL — ABNORMAL HIGH (ref 1.7–7.7)
Platelets: 261 10*3/uL (ref 150–400)
RBC: 4.42 MIL/uL (ref 3.87–5.11)
RDW: 16.2 % — ABNORMAL HIGH (ref 11.5–15.5)
WBC: 20.6 10*3/uL — AB (ref 4.0–10.5)

## 2016-01-14 LAB — BASIC METABOLIC PANEL
ANION GAP: 10 (ref 5–15)
BUN: 15 mg/dL (ref 6–20)
CHLORIDE: 102 mmol/L (ref 101–111)
CO2: 22 mmol/L (ref 22–32)
Calcium: 9.5 mg/dL (ref 8.9–10.3)
Creatinine, Ser: 0.93 mg/dL (ref 0.44–1.00)
GFR calc Af Amer: >60 mL/min (ref >60)
GFR calc non Af Amer: 56 mL/min — ABNORMAL LOW (ref >60)
Glucose, Bld: 95 mg/dL (ref 65–99)
POTASSIUM: 3.7 mmol/L (ref 3.5–5.1)
SODIUM: 134 mmol/L — AB (ref 135–145)

## 2016-01-14 LAB — I-STAT CG4 LACTIC ACID, ED: LACTIC ACID, VENOUS: 1.08 mmol/L (ref 0.5–1.9)

## 2016-01-14 MED ORDER — AZITHROMYCIN 250 MG PO TABS
500.0000 mg | ORAL_TABLET | Freq: Once | ORAL | Status: AC
  Administered 2016-01-14: 500 mg via ORAL
  Filled 2016-01-14: qty 2

## 2016-01-14 MED ORDER — PREDNISONE 20 MG PO TABS
40.0000 mg | ORAL_TABLET | Freq: Once | ORAL | Status: AC
  Administered 2016-01-14: 40 mg via ORAL
  Filled 2016-01-14: qty 2

## 2016-01-14 MED ORDER — IPRATROPIUM-ALBUTEROL 0.5-2.5 (3) MG/3ML IN SOLN
3.0000 mL | RESPIRATORY_TRACT | Status: AC
  Administered 2016-01-14 (×3): 3 mL via RESPIRATORY_TRACT
  Filled 2016-01-14: qty 6
  Filled 2016-01-14: qty 3

## 2016-01-14 MED ORDER — AZITHROMYCIN 250 MG PO TABS: 250.0000 mg | ORAL_TABLET | Freq: Every day | ORAL | 0 refills | Status: AC

## 2016-01-14 MED ORDER — PREDNISONE 10 MG PO TABS: 40.0000 mg | ORAL_TABLET | Freq: Every day | ORAL | 0 refills | Status: AC

## 2016-01-14 MED ORDER — SODIUM CHLORIDE 0.9 % IV BOLUS (SEPSIS)
500.0000 mL | Freq: Once | INTRAVENOUS | Status: AC
  Administered 2016-01-14: 500 mL via INTRAVENOUS

## 2016-01-14 NOTE — ED Provider Notes (Signed)
[facility]   CSN: [id number] Arrival date & time: 01/14/16  1419     History   Chief Complaint Chief Complaint  Patient presents with  . Cough  . Altered Mental Status    HPI Tina Bishop is a 82 y.o. female.  The history is provided by the patient and a relative.  Cough  This is a new problem. Episode onset: 3 days. The problem occurs every few minutes. The problem has been gradually worsening. The cough is productive of purulent sputum. There has been no fever. Associated symptoms include rhinorrhea, sore throat, shortness of breath and wheezing. Pertinent negatives include no chest pain, no chills and no myalgias. She has tried decongestants for the symptoms. She is not a smoker. Her past medical history is significant for bronchitis, pneumonia and COPD.  Altered Mental Status   Her past medical history is significant for COPD.  Grand-daughter states that she is slower to respond, but is fully oriented. Pt has hearing loss and does not have her hearing aids on.    Past Medical History:  Diagnosis Date  . Altered mental status   . CAD (coronary artery disease)   . COPD (chronic obstructive pulmonary disease) (HCC)   . Diastolic dysfunction   . Emphysema with both acute and chronic bronchitis (HCC)   . Hyperlipidemia   . Hypothyroidism   . Osteoporosis   . Rheumatoid arthritis(714.0)   . Vitamin D deficiency     Patient Active Problem List   Diagnosis Date Noted  . Convulsions/seizures (HCC) 08/10/2012  . Mild cognitive impairment 08/10/2012  . Pulmonary fibrosis (HCC) 02/26/2012    Past Surgical History:  Procedure Laterality Date  . ABDOMINAL HYSTERECTOMY  06-1984    OB History    No data available       Home Medications    Prior to Admission medications   Medication Sig Start Date End Date Taking? Authorizing Provider  amLODipine (NORVASC) 5 MG tablet Take 5 mg by mouth daily.   Yes Historical Provider, MD    levothyroxine (SYNTHROID, LEVOTHROID) 50 MCG tablet Take 50 mcg by mouth daily before breakfast.   Yes Historical Provider, MD  loratadine (CLARITIN) 10 MG tablet Take 10 mg by mouth daily.   Yes Historical Provider, MD  omeprazole (PRILOSEC) 20 MG capsule Take 20 mg by mouth daily.   Yes Historical Provider, MD  tiotropium (SPIRIVA) 18 MCG inhalation capsule Place 18 mcg into inhaler and inhale daily.   Yes Historical Provider, MD  acetaminophen-codeine (TYLENOL #3) 300-30 MG per tablet Take 300 tablets by mouth as needed. 07/19/12   Historical Provider, MD  aspirin 81 MG tablet Take 81 mg by mouth daily.    Historical Provider, MD  azithromycin (ZITHROMAX) 250 MG tablet Take 1 tablet (250 mg total) by mouth daily. Take first 2 tablets together, then 1 every day until finished. 01/15/16 01/19/16  Nira Conn, MD  CALCIUM PO Take 1 tablet by mouth daily.    Historical Provider, MD  cholecalciferol (VITAMIN D) 1000 UNITS tablet Take 1,000 Units by mouth daily.    Historical Provider, MD  clobetasol (TEMOVATE) 0.05 % external solution Apply topically 2 (two) times daily.  01/28/12   Historical Provider, MD  folic acid (FOLVITE) 1 MG tablet Take 1 mg by mouth daily.    Historical Provider, MD  hydroxychloroquine (PLAQUENIL) 200 MG tablet Take 200 mg by mouth daily.    Historical Provider, MD  ketorolac (ACULAR) 0.5 % ophthalmic  solution Apply 0.5 drops to eye daily. 07/20/12   Historical Provider, MD  levETIRAcetam (KEPPRA XR) 500 MG 24 hr tablet Take 500 mg by mouth daily.    Historical Provider, MD  methotrexate (RHEUMATREX) 2.5 MG tablet Take 2.5 mg by mouth once a week. Caution:Chemotherapy. Protect from light.    Historical Provider, MD  nystatin cream (MYCOSTATIN) Apply 100,000 application topically daily. 07/29/12   Historical Provider, MD  pravastatin (PRAVACHOL) 20 MG tablet Take 20 mg by mouth daily. 07/29/12   Historical Provider, MD  predniSONE (DELTASONE) 10 MG tablet Take 4 tablets  (40 mg total) by mouth daily. 01/14/16 01/18/16  Nira Conn, MD  triamcinolone cream (KENALOG) 0.1 % Apply 1 application topically 2 (two) times daily.    Historical Provider, MD  valACYclovir (VALTREX) 500 MG tablet Take 500 mg by mouth daily.    Historical Provider, MD  VIGAMOX 0.5 % ophthalmic solution Apply 0.5 drops to eye daily. 07/19/12   Historical Provider, MD    Family History Family History  Problem Relation Age of Onset  . Heart failure Father   . Cancer Brother     Social History Social History  Substance Use Topics  . Smoking status: Former Smoker    Packs/day: 1.00    Years: 60.00    Types: Cigarettes    Start date: 02/26/2007  . Smokeless tobacco: Never Used  . Alcohol use No     Allergies   Sulfa antibiotics   Review of Systems Review of Systems  Constitutional: Negative for chills.  HENT: Positive for rhinorrhea and sore throat.   Respiratory: Positive for cough, shortness of breath and wheezing.   Cardiovascular: Negative for chest pain.  Musculoskeletal: Negative for myalgias.  Ten systems are reviewed and are negative for acute change except as noted in the HPI    Physical Exam Updated Vital Signs BP 135/69   Pulse 108   Temp 100.7 F (38.2 C) (Rectal)   Resp 26   Ht 5\' 2"  (1.575 m)   Wt 170 lb (77.1 kg)   SpO2 93%   BMI 31.09 kg/m   Physical Exam  Constitutional: She is oriented to person, place, and time. She appears well-developed and well-nourished. No distress.  HENT:  Head: Normocephalic and atraumatic.  Nose: Nose normal.  Eyes: Conjunctivae and EOM are normal. Pupils are equal, round, and reactive to light. Right eye exhibits no discharge. Left eye exhibits no discharge. No scleral icterus.  Neck: Normal range of motion. Neck supple.  Cardiovascular: Normal rate and regular rhythm.  Exam reveals no gallop and no friction rub.   No murmur heard. Pulmonary/Chest: Effort normal and breath sounds normal. No stridor. No  respiratory distress. She has no wheezes. She has no rales.  Poor air movement  Abdominal: Soft. She exhibits no distension. There is no tenderness.  Musculoskeletal: She exhibits no edema or tenderness.  Neurological: She is alert and oriented to person, place, and time.  Hard of hearing  Skin: Skin is warm and dry. No rash noted. She is not diaphoretic. No erythema.  Psychiatric: She has a normal mood and affect.  Vitals reviewed.    ED Treatments / Results  Labs (all labs ordered are listed, but only abnormal results are displayed) Labs Reviewed  CBC WITH DIFFERENTIAL/PLATELET - Abnormal; Notable for the following:       Result Value   WBC 20.6 (*)    RDW 16.2 (*)    Neutro Abs 16.5 (*)  Monocytes Absolute 1.6 (*)    All other components within normal limits  BASIC METABOLIC PANEL - Abnormal; Notable for the following:    Sodium 134 (*)    GFR calc non Af Amer 56 (*)    All other components within normal limits  CULTURE, BLOOD (ROUTINE X 2)  CULTURE, BLOOD (ROUTINE X 2)  URINALYSIS, ROUTINE W REFLEX MICROSCOPIC  I-STAT CG4 LACTIC ACID, ED    EKG  EKG Interpretation None       Radiology Dg Chest 2 View  Result Date: 01/14/2016 CLINICAL DATA:  Cough for 3 days. EXAM: CHEST  2 VIEW COMPARISON:  04/26/2015 FINDINGS: Prominent lung markings, particularly the lung bases. These findings appear to be chronic. There is no focal airspace disease. Heart and mediastinum are within normal limits. No pleural effusions. Mild endplate changes in the thoracic spine. IMPRESSION: No active cardiopulmonary disease. Electronically Signed   By: Richarda Overlie M.D.   On: 01/14/2016 15:28    Procedures Procedures (including critical care time)  Medications Ordered in ED Medications  sodium chloride 0.9 % bolus 500 mL (0 mLs Intravenous Stopped 01/14/16 1705)  ipratropium-albuterol (DUONEB) 0.5-2.5 (3) MG/3ML nebulizer solution 3 mL (3 mLs Nebulization Given 01/14/16 1626)  predniSONE  (DELTASONE) tablet 40 mg (40 mg Oral Given 01/14/16 1547)  azithromycin (ZITHROMAX) tablet 500 mg (500 mg Oral Given 01/14/16 1547)     Initial Impression / Assessment and Plan / ED Course  I have reviewed the triage vital signs and the nursing notes.  Pertinent labs & imaging results that were available during my care of the patient were reviewed by me and considered in my medical decision making (see chart for details).  Clinical Course as of Jan 14 1739  Sun Jan 14, 2016  1600 Presentation consistent with COPD exacerbation. Chest x-ray without evidence of focal pneumonia. Patient does have leukocytosis and flulike symptoms. Likely viral process.   Patient was provided with breathing treatment, steroids, and azithromycin.    [PC]  1739 Significant improvement and air movement following treatment and patient's symptomatology. Satting well on room air.  She is well-appearing, nontoxic. Lactic acid within normal limits.  Given her significant improvement, feel patient is safe for discharge with strict return precautions.  [PC]    Clinical Course User Index [PC] Nira Conn, MD      Final Clinical Impressions(s) / ED Diagnoses   Final diagnoses:  Cough  Flu-like symptoms  COPD exacerbation (HCC)   Disposition: Discharge  Condition: Good  I have discussed the results, Dx and Tx plan with the patient who expressed understanding and agree(s) with the plan. Discharge instructions discussed at great length. The patient was given strict return precautions who verbalized understanding of the instructions. No further questions at time of discharge.    New Prescriptions   AZITHROMYCIN (ZITHROMAX) 250 MG TABLET    Take 1 tablet (250 mg total) by mouth daily. Take first 2 tablets together, then 1 every day until finished.   PREDNISONE (DELTASONE) 10 MG TABLET    Take 4 tablets (40 mg total) by mouth daily.    Follow Up: Lupita Raider, MD [address] [place] [address] [phone]     Lupita Raider, MD [address] [place] [address] [phone]  Schedule an appointment as soon as possible for a visit  If symptoms do not improve or  worsen, in 3-5 days      Nira Conn, MD 01/14/16  1741  

## 2016-01-14 NOTE — ED Triage Notes (Signed)
Patient reports that she has had a sore throat, cough and SOB since Friday. Patient has audible upper respiratory congestion in triage.

## 2016-01-19 LAB — CULTURE, BLOOD (ROUTINE X 2)
Culture: NO GROWTH
Culture: NO GROWTH

## 2020-07-21 ENCOUNTER — Emergency Department (HOSPITAL_BASED_OUTPATIENT_CLINIC_OR_DEPARTMENT_OTHER): Payer: Medicare PPO

## 2020-07-21 ENCOUNTER — Other Ambulatory Visit: Payer: Self-pay

## 2020-07-21 ENCOUNTER — Encounter (HOSPITAL_BASED_OUTPATIENT_CLINIC_OR_DEPARTMENT_OTHER): Payer: Self-pay

## 2020-07-21 ENCOUNTER — Emergency Department (HOSPITAL_BASED_OUTPATIENT_CLINIC_OR_DEPARTMENT_OTHER)
Admission: EM | Admit: 2020-07-21 | Discharge: 2020-07-21 | Disposition: A | Discharge status: Home / Self Care | Payer: Medicare PPO | Attending: Emergency Medicine | Admitting: Emergency Medicine

## 2020-07-21 DIAGNOSIS — U071 COVID-19: Secondary | ICD-10-CM | POA: Insufficient documentation

## 2020-07-21 DIAGNOSIS — I5030 Unspecified diastolic (congestive) heart failure: Secondary | ICD-10-CM | POA: Diagnosis not present

## 2020-07-21 DIAGNOSIS — Z79899 Other long term (current) drug therapy: Secondary | ICD-10-CM | POA: Diagnosis not present

## 2020-07-21 DIAGNOSIS — Z7982 Long term (current) use of aspirin: Secondary | ICD-10-CM | POA: Diagnosis not present

## 2020-07-21 DIAGNOSIS — Z87891 Personal history of nicotine dependence: Secondary | ICD-10-CM | POA: Insufficient documentation

## 2020-07-21 DIAGNOSIS — R519 Headache, unspecified: Secondary | ICD-10-CM | POA: Diagnosis present

## 2020-07-21 DIAGNOSIS — E039 Hypothyroidism, unspecified: Secondary | ICD-10-CM | POA: Insufficient documentation

## 2020-07-21 DIAGNOSIS — J449 Chronic obstructive pulmonary disease, unspecified: Secondary | ICD-10-CM | POA: Diagnosis not present

## 2020-07-21 DIAGNOSIS — I2510 Atherosclerotic heart disease of native coronary artery without angina pectoris: Secondary | ICD-10-CM | POA: Insufficient documentation

## 2020-07-21 LAB — COMPREHENSIVE METABOLIC PANEL
ALT: 16 U/L (ref 0–44)
AST: 36 U/L (ref 15–41)
Albumin: 4.6 g/dL (ref 3.5–5.0)
Alkaline Phosphatase: 94 U/L (ref 38–126)
Anion gap: 10 (ref 5–15)
BUN: 15 mg/dL (ref 8–23)
CO2: 22 mmol/L (ref 22–32)
Calcium: 10.1 mg/dL (ref 8.9–10.3)
Chloride: 101 mmol/L (ref 98–111)
Creatinine, Ser: 0.75 mg/dL (ref 0.44–1.00)
GFR, Estimated: >60 mL/min (ref >60)
Glucose, Bld: 80 mg/dL (ref 70–99)
Potassium: 4.7 mmol/L (ref 3.5–5.1)
Sodium: 133 mmol/L — ABNORMAL LOW (ref 135–145)
Total Bilirubin: 0.6 mg/dL (ref 0.3–1.2)
Total Protein: 8.6 g/dL — ABNORMAL HIGH (ref 6.5–8.1)

## 2020-07-21 LAB — CBC WITH DIFFERENTIAL/PLATELET
Abs Immature Granulocytes: 0.02 10*3/uL (ref 0.00–0.07)
Basophils Absolute: 0 10*3/uL (ref 0.0–0.1)
Basophils Relative: 1 %
Eosinophils Absolute: 0 10*3/uL (ref 0.0–0.5)
Eosinophils Relative: 1 %
HCT: 45.5 % (ref 36.0–46.0)
Hemoglobin: 14.9 g/dL (ref 12.0–15.0)
Immature Granulocytes: 1 %
Lymphocytes Relative: 33 %
Lymphs Abs: 1.4 10*3/uL (ref 0.7–4.0)
MCH: 29.7 pg (ref 26.0–34.0)
MCHC: 32.7 g/dL (ref 30.0–36.0)
MCV: 90.6 fL (ref 80.0–100.0)
Monocytes Absolute: 0.7 10*3/uL (ref 0.1–1.0)
Monocytes Relative: 17 %
Neutro Abs: 2 10*3/uL (ref 1.7–7.7)
Neutrophils Relative %: 47 %
Platelets: 199 10*3/uL (ref 150–400)
RBC: 5.02 MIL/uL (ref 3.87–5.11)
RDW: 15 % (ref 11.5–15.5)
WBC: 4.2 10*3/uL (ref 4.0–10.5)
nRBC: 0 % (ref 0.0–0.2)

## 2020-07-21 LAB — URINALYSIS, ROUTINE W REFLEX MICROSCOPIC
Bilirubin Urine: NEGATIVE
Glucose, UA: NEGATIVE mg/dL
Hgb urine dipstick: NEGATIVE
Ketones, ur: NEGATIVE mg/dL
Leukocytes,Ua: NEGATIVE
Nitrite: NEGATIVE
Protein, ur: NEGATIVE mg/dL
Specific Gravity, Urine: >1.03 — ABNORMAL HIGH (ref 1.005–1.030)
pH: 5.5 (ref 5.0–8.0)

## 2020-07-21 LAB — RESP PANEL BY RT-PCR (FLU A&B, COVID) ARPGX2
Influenza A by PCR: NEGATIVE
Influenza B by PCR: NEGATIVE
SARS Coronavirus 2 by RT PCR: POSITIVE — AB

## 2020-07-21 MED ORDER — NIRMATRELVIR/RITONAVIR (PAXLOVID)TABLET: 3.0000 | ORAL_TABLET | Freq: Two times a day (BID) | ORAL | 0 refills | Status: AC

## 2020-07-21 NOTE — ED Triage Notes (Addendum)
Per granddaughter pt with mid back pain x 2 week-pt with HA, disoriented, slurred speech x 2 days-states today pt "seems out of sorts" and fell in her bedroom-denies head/neck injury-pt NAD-A/O-answering ?s appropriately-to triage in w/c

## 2020-07-21 NOTE — ED Provider Notes (Signed)
[facility] EMERGENCY DEPARTMENT Provider Note   CSN: [id number] Arrival date & time: 07/21/20  1202     History Chief Complaint  Patient presents with   Back Pain   Headache    Tina Bishop is a 87 y.o. female.  The history is provided by the patient. No language interpreter was used.  Back Pain Location:  Lumbar spine Quality:  Aching Pain severity:  Mild Duration:  2 days Relieved by:  Nothing Worsened by:  Nothing Ineffective treatments:  None tried Associated symptoms: headaches   Headache Relieved by:  Nothing Worsened by:  Nothing Ineffective treatments:  None tried Associated symptoms: back pain     Pt complains of a cough and congestion.  Pt fell 2 days ago and has low back soreness.    Past Medical History:  Diagnosis Date   Altered mental status    CAD (coronary artery disease)    COPD (chronic obstructive pulmonary disease) (HCC)    Diastolic dysfunction    Emphysema with both acute and chronic bronchitis (HCC)    Hyperlipidemia    Hypothyroidism    Osteoporosis    Rheumatoid arthritis(714.0)    Vitamin D deficiency     Patient Active Problem List   Diagnosis Date Noted   Convulsions/seizures (HCC) 08/10/2012   Mild cognitive impairment 08/10/2012   Pulmonary fibrosis (HCC) 02/26/2012    Past Surgical History:  Procedure Laterality Date   ABDOMINAL HYSTERECTOMY  06-1984     OB History   No obstetric history on file.     Family History  Problem Relation Age of Onset   Heart failure Father    Cancer Brother     Social History   Tobacco Use   Smoking status: Former    Packs/day: 1.00    Years: 60.00    Pack years: 60.00    Types: Cigarettes    Start date: 02/26/2007   Smokeless tobacco: Never  Substance Use Topics   Alcohol use: No   Drug use: No    Home Medications Prior to Admission medications   Medication Sig Start Date End Date Taking? Authorizing Provider  nirmatrelvir/ritonavir EUA (PAXLOVID) TABS Take  3 tablets by mouth 2 (two) times daily for 5 days. Patient GFR is >60 Take nirmatrelvir (150 mg) two tablets twice daily for 5 days and ritonavir (100 mg) one tablet twice daily for 5 days. 07/21/20 07/26/20 Yes Cheron Schaumann K, PA-C  acetaminophen-codeine (TYLENOL #3) 300-30 MG per tablet Take 300 tablets by mouth as needed. 07/19/12   [provider]  amLODipine (NORVASC) 5 MG tablet Take 5 mg by mouth daily.    [provider]  aspirin 81 MG tablet Take 81 mg by mouth daily.    [provider]  CALCIUM PO Take 1 tablet by mouth daily.    [provider]  cholecalciferol (VITAMIN D) 1000 UNITS tablet Take 1,000 Units by mouth daily.    [provider]  clobetasol (TEMOVATE) 0.05 % external solution Apply topically 2 (two) times daily.  01/28/12   [provider]  folic acid (FOLVITE) 1 MG tablet Take 1 mg by mouth daily.    [provider]  hydroxychloroquine (PLAQUENIL) 200 MG tablet Take 200 mg by mouth daily.    [provider]  ketorolac (ACULAR) 0.5 % ophthalmic solution Apply 0.5 drops to eye daily. 07/20/12   [provider]  levETIRAcetam (KEPPRA XR) 500 MG 24 hr tablet Take 500 mg by mouth daily.  [provider]  levothyroxine (SYNTHROID, LEVOTHROID) 50 MCG tablet Take 50 mcg by mouth daily before breakfast.    [provider]  loratadine (CLARITIN) 10 MG tablet Take 10 mg by mouth daily.    [provider]  methotrexate (RHEUMATREX) 2.5 MG tablet Take 2.5 mg by mouth once a week. Caution:Chemotherapy. Protect from light.    [provider]  nystatin cream (MYCOSTATIN) Apply 100,000 application topically daily. 07/29/12   [provider]  omeprazole (PRILOSEC) 20 MG capsule Take 20 mg by mouth daily.    [provider]  pravastatin (PRAVACHOL) 20 MG tablet Take 20 mg by mouth daily. 07/29/12   [provider]  tiotropium (SPIRIVA) 18 MCG inhalation  capsule Place 18 mcg into inhaler and inhale daily.    [provider]  triamcinolone cream (KENALOG) 0.1 % Apply 1 application topically 2 (two) times daily.    [provider]  valACYclovir (VALTREX) 500 MG tablet Take 500 mg by mouth daily.    [provider]  VIGAMOX 0.5 % ophthalmic solution Apply 0.5 drops to eye daily. 07/19/12   [provider]    Allergies    Sulfa antibiotics  Review of Systems   Review of Systems  Musculoskeletal:  Positive for back pain.  Neurological:  Positive for headaches.  All other systems reviewed and are negative.  Physical Exam Updated Vital Signs BP 115/60 (BP Location: Left Arm)   Pulse 73   Temp 98.6 F (37 C) (Oral)   Resp 14   Ht 5\' 5"  (1.651 m)   Wt 71.7 kg   SpO2 (!) 88%   BMI 26.29 kg/m   Physical Exam Vitals and nursing note reviewed.  Constitutional:      Appearance: She is well-developed.  HENT:     Head: Normocephalic.  Eyes:     Extraocular Movements: Extraocular movements intact.     Pupils: Pupils are equal, round, and reactive to light.  Cardiovascular:     Rate and Rhythm: Normal rate and regular rhythm.     Heart sounds: Normal heart sounds.  Pulmonary:     Effort: Pulmonary effort is normal.  Abdominal:     General: There is no distension.  Musculoskeletal:        General: Normal range of motion.     Cervical back: Normal range of motion.  Neurological:     Mental Status: She is alert and oriented to person, place, and time.  Psychiatric:        Cognition and Memory: Cognition is not impaired.    ED Results / Procedures / Treatments   Labs (all labs ordered are listed, but only abnormal results are displayed) Labs Reviewed  RESP PANEL BY RT-PCR (FLU A&B, COVID) ARPGX2 - Abnormal; Notable for the following components:      Result Value   SARS Coronavirus 2 by RT PCR POSITIVE (*)    All other components within normal limits  COMPREHENSIVE METABOLIC PANEL - Abnormal;  Notable for the following components:   Sodium 133 (*)    Total Protein 8.6 (*)    All other components within normal limits  URINALYSIS, ROUTINE W REFLEX MICROSCOPIC - Abnormal; Notable for the following components:   APPearance CLOUDY (*)    Specific Gravity, Urine >1.030 (*)    All other components within normal limits  CBC WITH DIFFERENTIAL/PLATELET    EKG None  Radiology DG Lumbar Spine Complete  Result Date: 07/21/2020 CLINICAL DATA:  Mid back pain status  post fall EXAM: LUMBAR SPINE - COMPLETE 4+ VIEW; PELVIS - 1-2 VIEW COMPARISON:  None. FINDINGS: Pelvis: Atherosclerotic changes seen throughout visualized arterial segments. No acute fracture or dislocation. Lumbar spine: Alignment is within normal limits. Vertebral body heights are maintained. Mild disc height loss and endplate degenerative changes at L3-L4, L4-L5, L5-S1. Facet degenerative changes seen throughout the lumbar spine. IMPRESSION: No acute abnormality of the lumbar spine or pelvis. Electronically Signed   By: Acquanetta Belling M.D.   On: 07/21/2020 15:34   DG Pelvis 1-2 Views  Result Date: 07/21/2020 CLINICAL DATA:  Mid back pain status post fall EXAM: LUMBAR SPINE - COMPLETE 4+ VIEW; PELVIS - 1-2 VIEW COMPARISON:  None. FINDINGS: Pelvis: Atherosclerotic changes seen throughout visualized arterial segments. No acute fracture or dislocation. Lumbar spine: Alignment is within normal limits. Vertebral body heights are maintained. Mild disc height loss and endplate degenerative changes at L3-L4, L4-L5, L5-S1. Facet degenerative changes seen throughout the lumbar spine. IMPRESSION: No acute abnormality of the lumbar spine or pelvis. Electronically Signed   By: Acquanetta Belling M.D.   On: 07/21/2020 15:34   DG Chest Port 1 View  Result Date: 07/21/2020 CLINICAL DATA:  COVID-19 pneumonia. EXAM: PORTABLE CHEST 1 VIEW COMPARISON:  February 14, 2016. FINDINGS: The heart size and mediastinal contours are within normal limits. Stable bibasilar  subsegmental atelectasis is noted. The visualized skeletal structures are unremarkable. IMPRESSION: Stable bibasilar subsegmental atelectasis. Electronically Signed   By: Lupita Raider M.D.   On: 07/21/2020 16:08    Procedures Procedures   Medications Ordered in ED Medications - No data to display  ED Course  I have reviewed the triage vital signs and the nursing notes.  Pertinent labs & imaging results that were available during my care of the patient were reviewed by me and considered in my medical decision making (see chart for details).    MDM Rules/Calculators/A&P                          MDM:  chest xray  no pneumonia,  Ls spine and pelvis no fracture.   94% with probe on finger by me.  No shortness of breath.  Covid is positive.  Pt agrees to treatment with paxlovid.  I counseled on need to return if any problems.  An After Visit Summary was printed and given to the patient.  Final Clinical Impression(s) / ED Diagnoses Final diagnoses:  COVID    Rx / DC Orders ED Discharge Orders          Ordered    nirmatrelvir/ritonavir EUA (PAXLOVID) TABS  2 times daily        07/21/20 1652             Osie Cheeks 07/21/20 1808    Arby Barrette, MD 07/27/20 0900

## 2020-07-21 NOTE — Discharge Instructions (Addendum)
Return if symptoms worsen or cahnge.  °

## 2020-11-22 ENCOUNTER — Emergency Department (HOSPITAL_BASED_OUTPATIENT_CLINIC_OR_DEPARTMENT_OTHER): Payer: Medicare PPO

## 2020-11-22 ENCOUNTER — Inpatient Hospital Stay (HOSPITAL_BASED_OUTPATIENT_CLINIC_OR_DEPARTMENT_OTHER)
Admission: EM | Admit: 2020-11-22 | Discharge: 2020-11-26 | DRG: 871 | Disposition: A | Discharge status: Home Health | Payer: Medicare PPO | Attending: Internal Medicine | Admitting: Internal Medicine

## 2020-11-22 ENCOUNTER — Other Ambulatory Visit: Payer: Self-pay

## 2020-11-22 ENCOUNTER — Encounter (HOSPITAL_BASED_OUTPATIENT_CLINIC_OR_DEPARTMENT_OTHER): Payer: Self-pay | Admitting: Emergency Medicine

## 2020-11-22 DIAGNOSIS — I10 Essential (primary) hypertension: Secondary | ICD-10-CM

## 2020-11-22 DIAGNOSIS — Z28310 Unvaccinated for covid-19: Secondary | ICD-10-CM

## 2020-11-22 DIAGNOSIS — E876 Hypokalemia: Secondary | ICD-10-CM | POA: Diagnosis not present

## 2020-11-22 DIAGNOSIS — M069 Rheumatoid arthritis, unspecified: Secondary | ICD-10-CM

## 2020-11-22 DIAGNOSIS — A419 Sepsis, unspecified organism: Principal | ICD-10-CM | POA: Diagnosis present

## 2020-11-22 DIAGNOSIS — E860 Dehydration: Secondary | ICD-10-CM | POA: Diagnosis present

## 2020-11-22 DIAGNOSIS — M810 Age-related osteoporosis without current pathological fracture: Secondary | ICD-10-CM | POA: Diagnosis present

## 2020-11-22 DIAGNOSIS — G3184 Mild cognitive impairment, so stated: Secondary | ICD-10-CM | POA: Diagnosis present

## 2020-11-22 DIAGNOSIS — Z888 Allergy status to other drugs, medicaments and biological substances status: Secondary | ICD-10-CM

## 2020-11-22 DIAGNOSIS — Z79890 Hormone replacement therapy (postmenopausal): Secondary | ICD-10-CM

## 2020-11-22 DIAGNOSIS — E039 Hypothyroidism, unspecified: Secondary | ICD-10-CM

## 2020-11-22 DIAGNOSIS — J439 Emphysema, unspecified: Secondary | ICD-10-CM

## 2020-11-22 DIAGNOSIS — J8410 Pulmonary fibrosis, unspecified: Secondary | ICD-10-CM | POA: Diagnosis present

## 2020-11-22 DIAGNOSIS — Z20822 Contact with and (suspected) exposure to covid-19: Secondary | ICD-10-CM | POA: Diagnosis present

## 2020-11-22 DIAGNOSIS — I4510 Unspecified right bundle-branch block: Secondary | ICD-10-CM | POA: Diagnosis present

## 2020-11-22 DIAGNOSIS — I2510 Atherosclerotic heart disease of native coronary artery without angina pectoris: Secondary | ICD-10-CM

## 2020-11-22 DIAGNOSIS — Z79899 Other long term (current) drug therapy: Secondary | ICD-10-CM

## 2020-11-22 DIAGNOSIS — Z7982 Long term (current) use of aspirin: Secondary | ICD-10-CM

## 2020-11-22 DIAGNOSIS — G9341 Metabolic encephalopathy: Secondary | ICD-10-CM | POA: Diagnosis present

## 2020-11-22 DIAGNOSIS — Z882 Allergy status to sulfonamides status: Secondary | ICD-10-CM

## 2020-11-22 DIAGNOSIS — G40909 Epilepsy, unspecified, not intractable, without status epilepticus: Secondary | ICD-10-CM | POA: Diagnosis present

## 2020-11-22 DIAGNOSIS — I110 Hypertensive heart disease with heart failure: Secondary | ICD-10-CM | POA: Diagnosis present

## 2020-11-22 DIAGNOSIS — J189 Pneumonia, unspecified organism: Secondary | ICD-10-CM | POA: Diagnosis present

## 2020-11-22 DIAGNOSIS — E871 Hypo-osmolality and hyponatremia: Secondary | ICD-10-CM | POA: Diagnosis present

## 2020-11-22 DIAGNOSIS — J9601 Acute respiratory failure with hypoxia: Secondary | ICD-10-CM | POA: Diagnosis present

## 2020-11-22 DIAGNOSIS — E785 Hyperlipidemia, unspecified: Secondary | ICD-10-CM | POA: Diagnosis present

## 2020-11-22 DIAGNOSIS — Z8249 Family history of ischemic heart disease and other diseases of the circulatory system: Secondary | ICD-10-CM

## 2020-11-22 DIAGNOSIS — Z90710 Acquired absence of both cervix and uterus: Secondary | ICD-10-CM

## 2020-11-22 DIAGNOSIS — J440 Chronic obstructive pulmonary disease with acute lower respiratory infection: Secondary | ICD-10-CM | POA: Diagnosis present

## 2020-11-22 DIAGNOSIS — Z87891 Personal history of nicotine dependence: Secondary | ICD-10-CM

## 2020-11-22 DIAGNOSIS — E559 Vitamin D deficiency, unspecified: Secondary | ICD-10-CM | POA: Diagnosis present

## 2020-11-22 LAB — CBC
HCT: 41.6 % (ref 36.0–46.0)
Hemoglobin: 13.8 g/dL (ref 12.0–15.0)
MCH: 29.6 pg (ref 26.0–34.0)
MCHC: 33.2 g/dL (ref 30.0–36.0)
MCV: 89.3 fL (ref 80.0–100.0)
Platelets: 309 10*3/uL (ref 150–400)
RBC: 4.66 MIL/uL (ref 3.87–5.11)
RDW: 15.5 % (ref 11.5–15.5)
WBC: 16.6 10*3/uL — ABNORMAL HIGH (ref 4.0–10.5)
nRBC: 0 % (ref 0.0–0.2)

## 2020-11-22 LAB — BASIC METABOLIC PANEL
Anion gap: 9 (ref 5–15)
BUN: 10 mg/dL (ref 8–23)
CO2: 23 mmol/L (ref 22–32)
Calcium: 10 mg/dL (ref 8.9–10.3)
Chloride: 101 mmol/L (ref 98–111)
Creatinine, Ser: 0.71 mg/dL (ref 0.44–1.00)
GFR, Estimated: >60 mL/min (ref >60)
Glucose, Bld: 95 mg/dL (ref 70–99)
Potassium: 3.9 mmol/L (ref 3.5–5.1)
Sodium: 133 mmol/L — ABNORMAL LOW (ref 135–145)

## 2020-11-22 LAB — I-STAT VENOUS BLOOD GAS, ED
Acid-base deficit: 1 mmol/L (ref 0.0–2.0)
Bicarbonate: 21.6 mmol/L (ref 20.0–28.0)
Calcium, Ion: 1.17 mmol/L (ref 1.15–1.40)
HCT: 41 % (ref 36.0–46.0)
Hemoglobin: 13.9 g/dL (ref 12.0–15.0)
O2 Saturation: 93 %
Patient temperature: 100
Potassium: 4 mmol/L (ref 3.5–5.1)
Sodium: 133 mmol/L — ABNORMAL LOW (ref 135–145)
TCO2: 23 mmol/L (ref 22–32)
pCO2, Ven: 30.7 mmHg — ABNORMAL LOW (ref 44.0–60.0)
pH, Ven: 7.458 — ABNORMAL HIGH (ref 7.250–7.430)
pO2, Ven: 66 mmHg — ABNORMAL HIGH (ref 32.0–45.0)

## 2020-11-22 LAB — LACTIC ACID, PLASMA
Lactic Acid, Venous: 1.3 mmol/L (ref 0.5–1.9)
Lactic Acid, Venous: 0.9 mmol/L (ref 0.5–1.9)

## 2020-11-22 LAB — RESP PANEL BY RT-PCR (FLU A&B, COVID) ARPGX2
Influenza A by PCR: NEGATIVE
Influenza B by PCR: NEGATIVE
SARS Coronavirus 2 by RT PCR: NEGATIVE

## 2020-11-22 LAB — TROPONIN I (HIGH SENSITIVITY)
Troponin I (High Sensitivity): 6 ng/L (ref <18)
Troponin I (High Sensitivity): 6 ng/L (ref <18)

## 2020-11-22 MED ORDER — SODIUM CHLORIDE 0.9 % IV SOLN
500.0000 mg | Freq: Once | INTRAVENOUS | Status: AC
  Administered 2020-11-22: 500 mg via INTRAVENOUS
  Filled 2020-11-22: qty 500

## 2020-11-22 MED ORDER — SODIUM CHLORIDE 0.9 % IV SOLN
1.0000 g | Freq: Once | INTRAVENOUS | Status: AC
  Administered 2020-11-22: 1 g via INTRAVENOUS
  Filled 2020-11-22: qty 10

## 2020-11-22 MED ORDER — IOHEXOL 350 MG/ML SOLN
75.0000 mL | Freq: Once | INTRAVENOUS | Status: AC | PRN
  Administered 2020-11-22: 75 mL via INTRAVENOUS

## 2020-11-22 MED ORDER — ALBUTEROL SULFATE HFA 108 (90 BASE) MCG/ACT IN AERS
INHALATION_SPRAY | RESPIRATORY_TRACT | Status: AC
  Administered 2020-11-22: 2
  Filled 2020-11-22: qty 6.7

## 2020-11-22 MED ORDER — SODIUM CHLORIDE 0.9 % IV BOLUS
1000.0000 mL | Freq: Once | INTRAVENOUS | Status: AC
  Administered 2020-11-22: 1000 mL via INTRAVENOUS

## 2020-11-22 NOTE — ED Provider Notes (Signed)
[facility] EMERGENCY DEPARTMENT Provider Note   CSN: [id number] Arrival date & time: 11/22/20  1503     History Chief Complaint  Patient presents with  . Shortness of Breath    Tina Bishop is a 87 y.o. female hx of CAD, COPD, HL, HTN, here with SOB and weakness and low oxygen.  Patient states that she has been coughing since yesterday.  She went to her doctor today was noted to be hypoxic with oxygen around 89 to 90%.  Patient has been feeling very weak and tired.   The history is provided by the patient.      Past Medical History:  Diagnosis Date  . Altered mental status   . CAD (coronary artery disease)   . COPD (chronic obstructive pulmonary disease) (HCC)   . Diastolic dysfunction   . Emphysema with both acute and chronic bronchitis (HCC)   . Hyperlipidemia   . Hypothyroidism   . Osteoporosis   . Rheumatoid arthritis(714.0)   . Vitamin D deficiency     Patient Active Problem List   Diagnosis Date Noted  . Convulsions/seizures (HCC) 08/10/2012  . Mild cognitive impairment 08/10/2012  . Pulmonary fibrosis (HCC) 02/26/2012    Past Surgical History:  Procedure Laterality Date  . ABDOMINAL HYSTERECTOMY  06-1984     OB History   No obstetric history on file.     Family History  Problem Relation Age of Onset  . Heart failure Father   . Cancer Brother     Social History   Tobacco Use  . Smoking status: Former    Packs/day: 1.00    Years: 60.00    Pack years: 60.00    Types: Cigarettes    Start date: 02/26/2007  . Smokeless tobacco: Never  Substance Use Topics  . Alcohol use: No  . Drug use: No    Home Medications Prior to Admission medications   Medication Sig Start Date End Date Taking? Authorizing Provider  acetaminophen-codeine (TYLENOL #3) 300-30 MG per tablet Take 300 tablets by mouth as needed. 07/19/12   [provider]  amLODipine (NORVASC) 5 MG tablet Take 5 mg by mouth daily.    [provider]  aspirin  81 MG tablet Take 81 mg by mouth daily.    [provider]  CALCIUM PO Take 1 tablet by mouth daily.    [provider]  cholecalciferol (VITAMIN D) 1000 UNITS tablet Take 1,000 Units by mouth daily.    [provider]  clobetasol (TEMOVATE) 0.05 % external solution Apply topically 2 (two) times daily.  01/28/12   [provider]  folic acid (FOLVITE) 1 MG tablet Take 1 mg by mouth daily.    [provider]  hydroxychloroquine (PLAQUENIL) 200 MG tablet Take 200 mg by mouth daily.    [provider]  ketorolac (ACULAR) 0.5 % ophthalmic solution Apply 0.5 drops to eye daily. 07/20/12   [provider]  levETIRAcetam (KEPPRA XR) 500 MG 24 hr tablet Take 500 mg by mouth daily.    [provider]  levothyroxine (SYNTHROID, LEVOTHROID) 50 MCG tablet Take 50 mcg by mouth daily before breakfast.    [provider]  loratadine (CLARITIN) 10 MG tablet Take 10 mg by mouth daily.    [provider]  methotrexate (RHEUMATREX) 2.5 MG tablet Take 2.5 mg by mouth once a week. Caution:Chemotherapy. Protect from light.    [provider]  nystatin cream (MYCOSTATIN) Apply 100,000 application topically daily. 07/29/12  [provider]  omeprazole (PRILOSEC) 20 MG capsule Take 20 mg by mouth daily.    [provider]  pravastatin (PRAVACHOL) 20 MG tablet Take 20 mg by mouth daily. 07/29/12   [provider]  tiotropium (SPIRIVA) 18 MCG inhalation capsule Place 18 mcg into inhaler and inhale daily.    [provider]  triamcinolone cream (KENALOG) 0.1 % Apply 1 application topically 2 (two) times daily.    [provider]  valACYclovir (VALTREX) 500 MG tablet Take 500 mg by mouth daily.    [provider]  VIGAMOX 0.5 % ophthalmic solution Apply 0.5 drops to eye daily. 07/19/12   [provider]    Allergies    Prednisone and Sulfa antibiotics  Review of Systems    Review of Systems  Respiratory:  Positive for shortness of breath.   All other systems reviewed and are negative.  Physical Exam Updated Vital Signs BP 113/66 (BP Location: Right Arm)   Pulse 97   Temp 99.4 F (37.4 C) (Oral)   Resp 20   Ht 5\' 5"  (1.651 m)   Wt 71.7 kg   SpO2 91%   BMI 26.29 kg/m   Physical Exam Vitals and nursing note reviewed.  Constitutional:      Comments: Coughing, uncomfortable   HENT:     Head: Normocephalic.  Eyes:     Extraocular Movements: Extraocular movements intact.     Pupils: Pupils are equal, round, and reactive to light.  Cardiovascular:     Rate and Rhythm: Normal rate and regular rhythm.  Pulmonary:     Comments: Crackles R side  Abdominal:     General: Bowel sounds are normal.     Palpations: Abdomen is soft.  Musculoskeletal:        General: Normal range of motion.     Cervical back: Normal range of motion and neck supple.  Skin:    General: Skin is warm.     Capillary Refill: Capillary refill takes less than 2 seconds.  Neurological:     General: No focal deficit present.     Mental Status: She is oriented to person, place, and time.  Psychiatric:        Mood and Affect: Mood normal.        Behavior: Behavior normal.    ED Results / Procedures / Treatments   Labs (all labs ordered are listed, but only abnormal results are displayed) Labs Reviewed  BASIC METABOLIC PANEL - Abnormal; Notable for the following components:      Result Value   Sodium 133 (*)    All other components within normal limits  CBC - Abnormal; Notable for the following components:   WBC 16.6 (*)    All other components within normal limits  RESP PANEL BY RT-PCR (FLU A&B, COVID) ARPGX2  CULTURE, BLOOD (ROUTINE X 2)  CULTURE, BLOOD (ROUTINE X 2)  BLOOD GAS, VENOUS  LACTIC ACID, PLASMA  LACTIC ACID, PLASMA  TROPONIN I (HIGH SENSITIVITY)  TROPONIN I (HIGH SENSITIVITY)    EKG EKG Interpretation  Date/Time:  Wednesday November 22 2020  15:24:53 EDT Ventricular Rate:  96 PR Interval:  150 QRS Duration: 116 QT Interval:  364 QTC Calculation: 459 R Axis:   68 Text Interpretation: Normal sinus rhythm Right bundle branch block Abnormal ECG No previous ECGs available Confirmed by [id number] [phone]) on 11/22/2020 5:38:31 PM  Radiology DG Chest 2 View  Result Date: 11/22/2020 CLINICAL DATA:  Shortness of breath. Weakness.  Altered mental status. EXAM: CHEST - 2 VIEW COMPARISON:  Radiograph 07/21/2020, CT 02/14/2016 FINDINGS: Background chronic bronchial thickening with underlying emphysema. There is increase in peripheral opacity in the right mid upper lung zone at site of prior scarring. Stable subpleural reticular opacity at the bases from prior chest radiograph. The heart is normal in size. Aortic atherosclerosis. No significant pleural effusion. No pneumothorax. IMPRESSION: 1. Slight increase in peripheral opacity in the right mid upper lung zone at site of prior scarring. This may represent pneumonia in this region, however recommend close clinical follow-up with PA and lateral views of the chest in 3-4 weeks to ensure resolution. 2. Background emphysema with bronchial thickening. Subpleural reticulation at the lung bases is stable from prior chest radiograph, and raises the possibility of underlying interstitial lung disease. Electronically Signed   By: Narda Rutherford M.D.   On: 11/22/2020 15:52    Procedures Procedures   CRITICAL CARE Performed by: Richardean Canal   Total critical care time: 30 minutes  Critical care time was exclusive of separately billable procedures and treating other patients.  Critical care was necessary to treat or prevent imminent or life-threatening deterioration.  Critical care was time spent personally by me on the following activities: development of treatment plan with patient and/or surrogate as well as nursing, discussions with consultants, evaluation of patient's response to treatment,  examination of patient, obtaining history from patient or surrogate, ordering and performing treatments and interventions, ordering and review of laboratory studies, ordering and review of radiographic studies, pulse oximetry and re-evaluation of patient's condition.   Medications Ordered in ED Medications  cefTRIAXone (ROCEPHIN) 1 g in sodium chloride 0.9 % 100 mL IVPB (has no administration in time range)  azithromycin (ZITHROMAX) 500 mg in sodium chloride 0.9 % 250 mL IVPB (has no administration in time range)  sodium chloride 0.9 % bolus 1,000 mL (has no administration in time range)    ED Course  I have reviewed the triage vital signs and the nursing notes.  Pertinent labs & imaging results that were available during my care of the patient were reviewed by me and considered in my medical decision making (see chart for details).    MDM Rules/Calculators/A&P                           Tina Bishop is a 87 y.o. female.  Presenting with shortness of breath and cough. Concern for possible COVID versus pneumonia versus PE.  Plan to get CBC and CMP and lactate and cultures and chest x-ray.  May need CTA chest as well  7:59 PM CTA chest showed no PE but showed right sided pneumonia.  White blood cell count is 16.  Lactate is normal.  Given Rocephin and azithromycin.  Patient desatted to 88% when she ambulates.  She is currently on 2 L nasal cannula.  Her COVID test is negative.  Will admit for hypoxia from pneumonia   Final Clinical Impression(s) / ED Diagnoses Final diagnoses:  None    Rx / DC Orders ED Discharge Orders     None        Charlynne Pander, MD 11/22/20 2000

## 2020-11-22 NOTE — ED Notes (Addendum)
Pt placed on 2L [place] for room air O2 of 88%. Respiratory at bedside.

## 2020-11-22 NOTE — ED Triage Notes (Addendum)
Pt sent by PCP for SOB and decreased O2, granddaughter with pt and reports weakness and AMS since yesterday, hx of COPD; pt also reports chest pain

## 2020-11-22 NOTE — ED Notes (Signed)
Patient transported to CT 

## 2020-11-22 NOTE — Progress Notes (Signed)
Patient: Tina Bishop, [name]   DOB: Mar 26, 2032  OBS:[id number] Transferring facility: [facility] Requesting provider: Dr. Silverio Lay (EDP at [facility]) Reason for transfer: admission for further evaluation and management of acute hypoxic respiratory distress due to community-acquired pneumonia.   87 year old female with history of COPD who presented to [facility] ED on 11-22-20 complaining of 2 days of progressive shortness of breath.  In the setting of no known baseline supplemental oxygen requirement, presented to PCPs office earlier today for evaluation of the above, with PCP recommending the patient go to ED for evaluation of new supplemental oxygen requirement.  At [facility], oxygen saturation noted to be 88% on room air, improving to 93 to 95% on 2 L nasal cannula.  Work-up notable for chest x-ray showing interval, with ensuing CTA chest showing no evidence of acute pulmonary embolism and confirming presence of RUL infiltrate.  COVID-19 negative.  Started on azithromycin and Rocephin for community-acquired pneumonia.  Subsequently, I accepted this patient for transfer for inpatient admission to a med-tele bed at either [facility] or [facility] (first available) for further work-up and management of acute hypoxic respiratory distress due to community-acquired pneumonia.     Newton Pigg, DO Hospitalist

## 2020-11-23 ENCOUNTER — Encounter (HOSPITAL_COMMUNITY): Payer: Self-pay | Admitting: Internal Medicine

## 2020-11-23 DIAGNOSIS — Z87891 Personal history of nicotine dependence: Secondary | ICD-10-CM | POA: Diagnosis not present

## 2020-11-23 DIAGNOSIS — Z20822 Contact with and (suspected) exposure to covid-19: Secondary | ICD-10-CM | POA: Diagnosis present

## 2020-11-23 DIAGNOSIS — Z7982 Long term (current) use of aspirin: Secondary | ICD-10-CM | POA: Diagnosis not present

## 2020-11-23 DIAGNOSIS — I4510 Unspecified right bundle-branch block: Secondary | ICD-10-CM | POA: Diagnosis present

## 2020-11-23 DIAGNOSIS — I110 Hypertensive heart disease with heart failure: Secondary | ICD-10-CM | POA: Diagnosis present

## 2020-11-23 DIAGNOSIS — E039 Hypothyroidism, unspecified: Secondary | ICD-10-CM | POA: Diagnosis present

## 2020-11-23 DIAGNOSIS — I10 Essential (primary) hypertension: Secondary | ICD-10-CM | POA: Diagnosis not present

## 2020-11-23 DIAGNOSIS — I2510 Atherosclerotic heart disease of native coronary artery without angina pectoris: Secondary | ICD-10-CM | POA: Diagnosis present

## 2020-11-23 DIAGNOSIS — E860 Dehydration: Secondary | ICD-10-CM | POA: Diagnosis present

## 2020-11-23 DIAGNOSIS — J8410 Pulmonary fibrosis, unspecified: Secondary | ICD-10-CM | POA: Diagnosis present

## 2020-11-23 DIAGNOSIS — G9341 Metabolic encephalopathy: Secondary | ICD-10-CM | POA: Diagnosis present

## 2020-11-23 DIAGNOSIS — J189 Pneumonia, unspecified organism: Secondary | ICD-10-CM

## 2020-11-23 DIAGNOSIS — M810 Age-related osteoporosis without current pathological fracture: Secondary | ICD-10-CM | POA: Diagnosis present

## 2020-11-23 DIAGNOSIS — E871 Hypo-osmolality and hyponatremia: Secondary | ICD-10-CM | POA: Diagnosis present

## 2020-11-23 DIAGNOSIS — E785 Hyperlipidemia, unspecified: Secondary | ICD-10-CM | POA: Diagnosis present

## 2020-11-23 DIAGNOSIS — M069 Rheumatoid arthritis, unspecified: Secondary | ICD-10-CM

## 2020-11-23 DIAGNOSIS — J9601 Acute respiratory failure with hypoxia: Secondary | ICD-10-CM | POA: Diagnosis present

## 2020-11-23 DIAGNOSIS — G3184 Mild cognitive impairment, so stated: Secondary | ICD-10-CM

## 2020-11-23 DIAGNOSIS — J440 Chronic obstructive pulmonary disease with acute lower respiratory infection: Secondary | ICD-10-CM | POA: Diagnosis present

## 2020-11-23 DIAGNOSIS — E559 Vitamin D deficiency, unspecified: Secondary | ICD-10-CM | POA: Diagnosis present

## 2020-11-23 DIAGNOSIS — J439 Emphysema, unspecified: Secondary | ICD-10-CM

## 2020-11-23 DIAGNOSIS — Z79899 Other long term (current) drug therapy: Secondary | ICD-10-CM | POA: Diagnosis not present

## 2020-11-23 DIAGNOSIS — Z90710 Acquired absence of both cervix and uterus: Secondary | ICD-10-CM | POA: Diagnosis not present

## 2020-11-23 DIAGNOSIS — R9431 Abnormal electrocardiogram [ECG] [EKG]: Secondary | ICD-10-CM | POA: Diagnosis not present

## 2020-11-23 DIAGNOSIS — A419 Sepsis, unspecified organism: Secondary | ICD-10-CM | POA: Diagnosis present

## 2020-11-23 DIAGNOSIS — Z8249 Family history of ischemic heart disease and other diseases of the circulatory system: Secondary | ICD-10-CM | POA: Diagnosis not present

## 2020-11-23 DIAGNOSIS — G40909 Epilepsy, unspecified, not intractable, without status epilepticus: Secondary | ICD-10-CM | POA: Diagnosis present

## 2020-11-23 LAB — BASIC METABOLIC PANEL
Anion gap: 7 (ref 5–15)
BUN: 6 mg/dL — ABNORMAL LOW (ref 8–23)
CO2: 24 mmol/L (ref 22–32)
Calcium: 9.6 mg/dL (ref 8.9–10.3)
Chloride: 106 mmol/L (ref 98–111)
Creatinine, Ser: 0.61 mg/dL (ref 0.44–1.00)
GFR, Estimated: >60 mL/min (ref >60)
Glucose, Bld: 100 mg/dL — ABNORMAL HIGH (ref 70–99)
Potassium: 3.5 mmol/L (ref 3.5–5.1)
Sodium: 137 mmol/L (ref 135–145)

## 2020-11-23 LAB — HIV ANTIBODY (ROUTINE TESTING W REFLEX): HIV Screen 4th Generation wRfx: NONREACTIVE

## 2020-11-23 LAB — STREP PNEUMONIAE URINARY ANTIGEN: Strep Pneumo Urinary Antigen: NEGATIVE

## 2020-11-23 MED ORDER — CALCIUM CARBONATE 1250 (500 CA) MG PO TABS
1.0000 | ORAL_TABLET | Freq: Every day | ORAL | Status: DC
  Administered 2020-11-23 – 2020-11-26 (×4): 500 mg via ORAL
  Filled 2020-11-23 (×4): qty 1

## 2020-11-23 MED ORDER — AMLODIPINE BESYLATE 5 MG PO TABS: 5.0000 mg | ORAL_TABLET | Freq: Every day | ORAL | Status: DC

## 2020-11-23 MED ORDER — ACETAMINOPHEN 325 MG PO TABS
650.0000 mg | ORAL_TABLET | Freq: Four times a day (QID) | ORAL | Status: DC | PRN
  Administered 2020-11-23 – 2020-11-24 (×2): 650 mg via ORAL
  Filled 2020-11-23 (×2): qty 2

## 2020-11-23 MED ORDER — SODIUM CHLORIDE 0.9 % IV SOLN
2.0000 g | INTRAVENOUS | Status: DC
  Administered 2020-11-23 – 2020-11-25 (×3): 2 g via INTRAVENOUS
  Filled 2020-11-23 (×3): qty 20

## 2020-11-23 MED ORDER — AZITHROMYCIN 250 MG PO TABS
500.0000 mg | ORAL_TABLET | Freq: Every day | ORAL | Status: AC
  Administered 2020-11-23 – 2020-11-26 (×4): 500 mg via ORAL
  Filled 2020-11-23 (×4): qty 2

## 2020-11-23 MED ORDER — LEVETIRACETAM ER 500 MG PO TB24: 500.0000 mg | ORAL_TABLET | Freq: Every day | ORAL | Status: DC

## 2020-11-23 MED ORDER — ENOXAPARIN SODIUM 40 MG/0.4ML IJ SOSY: 40.0000 mg | PREFILLED_SYRINGE | INTRAMUSCULAR | Status: DC

## 2020-11-23 MED ORDER — LACTATED RINGERS IV SOLN: INTRAVENOUS | Status: AC

## 2020-11-23 MED ORDER — LORATADINE 10 MG PO TABS
10.0000 mg | ORAL_TABLET | Freq: Every day | ORAL | Status: DC
  Administered 2020-11-23 – 2020-11-26 (×4): 10 mg via ORAL
  Filled 2020-11-23 (×4): qty 1

## 2020-11-23 MED ORDER — CALCIUM 500 MG PO TABS: 500.0000 mg | ORAL_TABLET | Freq: Every day | ORAL | Status: DC

## 2020-11-23 MED ORDER — GUAIFENESIN-DM 100-10 MG/5ML PO SYRP
5.0000 mL | ORAL_SOLUTION | ORAL | Status: DC | PRN
  Administered 2020-11-23 – 2020-11-26 (×5): 5 mL via ORAL
  Filled 2020-11-23 (×6): qty 5

## 2020-11-23 MED ORDER — PRAVASTATIN SODIUM 10 MG PO TABS
20.0000 mg | ORAL_TABLET | Freq: Every day | ORAL | Status: DC
  Administered 2020-11-23 – 2020-11-26 (×4): 20 mg via ORAL
  Filled 2020-11-23 (×4): qty 2

## 2020-11-23 MED ORDER — KETOTIFEN FUMARATE 0.025 % OP SOLN
1.0000 [drp] | Freq: Two times a day (BID) | OPHTHALMIC | Status: DC | PRN
Start: 2020-11-23 — End: 2020-11-26
  Filled 2020-11-23: qty 5

## 2020-11-23 MED ORDER — FOLIC ACID 1 MG PO TABS
1.0000 mg | ORAL_TABLET | Freq: Every day | ORAL | Status: DC
  Administered 2020-11-23 – 2020-11-26 (×4): 1 mg via ORAL
  Filled 2020-11-23 (×4): qty 1

## 2020-11-23 MED ORDER — LEVOTHYROXINE SODIUM 50 MCG PO TABS
50.0000 ug | ORAL_TABLET | Freq: Every day | ORAL | Status: DC
Start: 2020-11-24 — End: 2020-11-26
  Administered 2020-11-24 – 2020-11-26 (×3): 50 ug via ORAL
  Filled 2020-11-23 (×3): qty 1

## 2020-11-23 MED ORDER — IPRATROPIUM-ALBUTEROL 0.5-2.5 (3) MG/3ML IN SOLN: 3.0000 mL | RESPIRATORY_TRACT | Status: DC | PRN

## 2020-11-23 MED ORDER — PANTOPRAZOLE SODIUM 40 MG PO TBEC
40.0000 mg | DELAYED_RELEASE_TABLET | Freq: Every day | ORAL | Status: DC
  Administered 2020-11-23 – 2020-11-26 (×4): 40 mg via ORAL
  Filled 2020-11-23 (×4): qty 1

## 2020-11-23 MED ORDER — VITAMIN D 25 MCG (1000 UNIT) PO TABS
1000.0000 [IU] | ORAL_TABLET | Freq: Every day | ORAL | Status: DC
Start: 2020-11-23 — End: 2020-11-26
  Administered 2020-11-23 – 2020-11-26 (×4): 1000 [IU] via ORAL
  Filled 2020-11-23 (×4): qty 1

## 2020-11-23 MED ORDER — AMLODIPINE BESYLATE 5 MG PO TABS
2.5000 mg | ORAL_TABLET | Freq: Every day | ORAL | Status: DC
  Administered 2020-11-23 – 2020-11-24 (×2): 2.5 mg via ORAL
  Filled 2020-11-23 (×3): qty 1

## 2020-11-23 MED ORDER — INFLUENZA VAC A&B SA ADJ QUAD 0.5 ML IM PRSY
0.5000 mL | PREFILLED_SYRINGE | INTRAMUSCULAR | Status: DC
  Filled 2020-11-23: qty 0.5

## 2020-11-23 MED ORDER — UMECLIDINIUM BROMIDE 62.5 MCG/ACT IN AEPB
1.0000 | INHALATION_SPRAY | Freq: Every day | RESPIRATORY_TRACT | Status: DC
  Administered 2020-11-24: 1 via RESPIRATORY_TRACT
  Filled 2020-11-23: qty 7

## 2020-11-23 MED ORDER — VALACYCLOVIR HCL 500 MG PO TABS: 500.0000 mg | ORAL_TABLET | Freq: Every day | ORAL | Status: DC

## 2020-11-23 MED ORDER — ASPIRIN EC 81 MG PO TBEC
81.0000 mg | DELAYED_RELEASE_TABLET | Freq: Every day | ORAL | Status: DC
  Administered 2020-11-23 – 2020-11-26 (×4): 81 mg via ORAL
  Filled 2020-11-23 (×4): qty 1

## 2020-11-23 NOTE — ED Notes (Signed)
Noted pt febrile, MD contacted, orders obtained for tylenol prn fever, see MAR.

## 2020-11-23 NOTE — Plan of Care (Signed)
  Problem: Clinical Measurements: Goal: Ability to maintain clinical measurements within normal limits will improve Outcome: Progressing Goal: Diagnostic test results will improve Outcome: Progressing Goal: Respiratory complications will improve Outcome: Progressing   Problem: Safety: Goal: Ability to remain free from injury will improve Outcome: Progressing   Problem: Skin Integrity: Goal: Risk for impaired skin integrity will decrease Outcome: Progressing

## 2020-11-23 NOTE — ED Notes (Signed)
Spoke to [place], granddaughter, and notified her of patients room assignment at [facility]

## 2020-11-23 NOTE — Evaluation (Signed)
Clinical/Bedside Swallow Evaluation Patient Details  Name: Tina Bishop MRN: [id number] Date of Birth: Jan 22, 1932  Today's Date: 11/23/2020 Time: SLP Start Time (ACUTE ONLY): 1440 SLP Stop Time (ACUTE ONLY): 1505 SLP Time Calculation (min) (ACUTE ONLY): 25 min  Past Medical History:  Past Medical History:  Diagnosis Date   Altered mental status    CAD (coronary artery disease)    COPD (chronic obstructive pulmonary disease) (HCC)    Diastolic dysfunction    Emphysema with both acute and chronic bronchitis (HCC)    Hyperlipidemia    Hypothyroidism    Osteoporosis    Rheumatoid arthritis(714.0)    Vitamin D deficiency    Past Surgical History:  Past Surgical History:  Procedure Laterality Date   ABDOMINAL HYSTERECTOMY  06-1984   HPI:  87yo female admitted 11/22/20 with dyspnea, cough. PMH:HTN, HLD, CAD, diastolic cardiomyopathy, COPD/emphysema, hypothyroidism, RA, seizure disorder.    Assessment / Plan / Recommendation  Clinical Impression  Pt seen at bedside for assessment of swallow function and safety. Pt was awake and alert, resting in bed. Coughing noted prior to PO presentations. Pt presents with adequate orofacial strength. CN exam is unremarkable. She has upper dentures only. Evaluation was challenged by intermittent coughing, noted throughout this evaluation. At times, pt would not cough immediately following PO presentations, however, coughing was more present than absent. It is for these reasons that proceeding with instrumental study is recommended to confirm the presence or absence of aspiration. Will schedule MBS for tomorrow with radiology. RN and MD informed.  SLP Visit Diagnosis: Dysphagia, unspecified (R13.10)    Aspiration Risk  Mild aspiration risk    Diet Recommendation Regular;Thin liquid   Liquid Administration via: Cup;Straw Medication Administration: Whole meds with puree Supervision: Patient able to self feed Compensations: Slow rate;Small  sips/bites Postural Changes: Seated upright at 90 degrees    Other  Recommendations Oral Care Recommendations: Oral care BID Other Recommendations: Have oral suction available    Recommendations for follow up therapy are one component of a multi-disciplinary discharge planning process, led by the attending physician.  Recommendations may be updated based on patient status, additional functional criteria and insurance authorization.  Follow up Recommendations   Pending MBS results/recommendations     Frequency and Duration  Pending MBS results/recommendations         Prognosis Prognosis for Safe Diet Advancement: Fair      Swallow Study   General Date of Onset: 11/22/20 HPI: 87yo female admitted 11/22/20 with dyspnea, cough. PMH:HTN, HLD, CAD, diastolic cardiomyopathy, COPD/emphysema, hypothyroidism, RA, seizure disorder. Type of Study: Bedside Swallow Evaluation Previous Swallow Assessment: none Diet Prior to this Study: Regular;Thin liquids Temperature Spikes Noted: No Respiratory Status: Nasal cannula History of Recent Intubation: No Behavior/Cognition: Alert;Cooperative;Pleasant mood Oral Cavity Assessment: Within Functional Limits Oral Care Completed by SLP: No Oral Cavity - Dentition: Dentures, top Vision: Functional for self-feeding Self-Feeding Abilities: Able to feed self Patient Positioning: Upright in bed Baseline Vocal Quality: Normal Volitional Cough: Strong Volitional Swallow: Able to elicit    Oral/Motor/Sensory Function Overall Oral Motor/Sensory Function: Within functional limits   Ice Chips Ice chips: Not tested   Thin Liquid Presentation: Straw;Cup Other Comments: intermittent coughing throughout eval    Nectar Thick Presentation: Straw Other Comments: intermittent coughing throughout evaluation   Honey Thick Honey Thick Liquid: Not tested   Puree Other Comments: intermittent coughing throughout evaluation   Solid     Presentation: Self Fed Other  Comments: intermittent coughing throughout evaluation  [name] B. Murvin Natal, [facility], CCC-SLP Speech Language Pathologist Office: [phone]  Leigh Aurora 11/23/2020,3:09 PM

## 2020-11-23 NOTE — H&P (Addendum)
History and Physical    DOA: 11/22/2020  PCP: Heron Nay, PA  Patient coming from: home  Chief Complaint: dyspnea, cough  HPI: Tina Bishop is a 87 y.o. female with history h/o HTN, hyperlipidemia, CAD, diastolic cardiomyopathy, COPD/emphysema not on home O2, hypothyroidism, rheumatoid arthritis, seizure disorder presented initially to PCP office , then directed to [facility] ED yesterday evening for c/o progressive shortness of breath over 2 days. She also reports productive cough with white phlegm but denies noting any fevers. No c/o CP, abd pain or diarrhea. Denies any h/o sick contacts. Got flu shot last year but not this year. States doesn't get COVID vaccine by choice ED course: T max 100.100F, BP 90/47-143/80, RR 20-36, oxygen saturation noted to be 88% on room air, improving to 93 to 95% on 2 L nasal cannula. WBC 16.6K, Hgb 13.8, Na 133, K 3.9, bicarb 23, BUN/cr 10/0.7. Lactate 1.3->0.9.CXR/CTA chest revealed Emphysema, airspace opacity peripherally in the right upper lobe, likely pneumonia. Scarring/fibrosis at the lung bases. No effusions or lymphadenopathy.No PE . Patient initiated on IV rocephin/azithromycin for CAP and requested admission for further management.  Review of Systems: As per HPI, otherwise review of systems negative.    Past Medical History:  Diagnosis Date   Altered mental status    CAD (coronary artery disease)    COPD (chronic obstructive pulmonary disease) (HCC)    Diastolic dysfunction    Emphysema with both acute and chronic bronchitis (HCC)    Hyperlipidemia    Hypothyroidism    Osteoporosis    Rheumatoid arthritis(714.0)    Vitamin D deficiency     Past Surgical History:  Procedure Laterality Date   ABDOMINAL HYSTERECTOMY  06-1984    Social history:  reports that she has quit smoking. Her smoking use included cigarettes. She started smoking about 13 years ago. She has a 60.00 pack-year smoking history. She has never used smokeless tobacco. She  reports that she does not drink alcohol and does not use drugs.   Allergies  Allergen Reactions   Prednisone    Sulfa Antibiotics Rash    Family History  Problem Relation Age of Onset   Heart failure Father    Cancer Brother       Prior to Admission medications   Medication Sig Start Date End Date Taking? Authorizing Provider  acetaminophen-codeine (TYLENOL #3) 300-30 MG per tablet Take 300 tablets by mouth as needed. 07/19/12   [provider]  amLODipine (NORVASC) 5 MG tablet Take 5 mg by mouth daily.    [provider]  aspirin 81 MG tablet Take 81 mg by mouth daily.    [provider]  CALCIUM PO Take 1 tablet by mouth daily.    [provider]  cholecalciferol (VITAMIN D) 1000 UNITS tablet Take 1,000 Units by mouth daily.    [provider]  clobetasol (TEMOVATE) 0.05 % external solution Apply topically 2 (two) times daily.  01/28/12   [provider]  folic acid (FOLVITE) 1 MG tablet Take 1 mg by mouth daily.    [provider]  hydroxychloroquine (PLAQUENIL) 200 MG tablet Take 200 mg by mouth daily.    [provider]  ketorolac (ACULAR) 0.5 % ophthalmic solution Apply 0.5 drops to eye daily. 07/20/12   [provider]  levETIRAcetam (KEPPRA XR) 500 MG 24 hr tablet Take 500 mg by mouth daily.    [provider]  levothyroxine (SYNTHROID, LEVOTHROID) 50 MCG tablet Take 50 mcg by mouth daily  before breakfast.    [provider]  loratadine (CLARITIN) 10 MG tablet Take 10 mg by mouth daily.    [provider]  methotrexate (RHEUMATREX) 2.5 MG tablet Take 2.5 mg by mouth once a week. Caution:Chemotherapy. Protect from light.    [provider]  nystatin cream (MYCOSTATIN) Apply 100,000 application topically daily. 07/29/12   [provider]  omeprazole (PRILOSEC) 20 MG capsule Take 20 mg by mouth daily.    [provider]  pravastatin (PRAVACHOL) 20 MG  tablet Take 20 mg by mouth daily. 07/29/12   [provider]  tiotropium (SPIRIVA) 18 MCG inhalation capsule Place 18 mcg into inhaler and inhale daily.    [provider]  triamcinolone cream (KENALOG) 0.1 % Apply 1 application topically 2 (two) times daily.    [provider]  valACYclovir (VALTREX) 500 MG tablet Take 500 mg by mouth daily.    [provider]  VIGAMOX 0.5 % ophthalmic solution Apply 0.5 drops to eye daily. 07/19/12   [provider]    Physical Exam: Vitals:   11/23/20 0730 11/23/20 0900 11/23/20 1010 11/23/20 1134  BP: 101/76 (!) 112/48 134/78 (!) 114/57  Pulse: 75 74 81 79  Resp: (!) 30 (!) 22 17 18   Temp:    98 F (36.7 C)  TempSrc:    Oral  SpO2: 93% 95% 95% 96%  Weight:      Height:        Constitutional: NAD, calm, comfortable Eyes: PERRL, lids and conjunctivae normal ENMT: Mucous membranes are moist. Posterior pharynx clear of any exudate or lesions.Normal dentition.  Neck: normal, supple, no masses, no thyromegaly Respiratory: clear to auscultation bilaterally, no wheezing, no crackles. Normal respiratory effort. No accessory muscle use. On 3 lits o2 Cardiovascular: Regular rate and rhythm, no murmurs / rubs / gallops. No extremity edema. 2+ pedal pulses. No carotid bruits.  Abdomen: no tenderness, no masses palpated. No hepatosplenomegaly. Bowel sounds positive.  Musculoskeletal: no clubbing / cyanosis. No joint deformity upper and lower extremities. Good ROM, no contractures. Normal muscle tone.  Neurologic: CN 2-12 grossly intact. Sensation intact, DTR normal. Strength 5/5 in all 4.  Psychiatric: Normal judgment and insight. Alert and oriented x 3 currentlly. Normal mood.  SKIN/catheters: no rashes, lesions, ulcers. No induration  Labs on Admission: I have personally reviewed following labs and imaging studies  CBC: Recent Labs  Lab 11/22/20 1537 11/22/20 1809  WBC 16.6*  --   HGB 13.8 13.9  HCT 41.6  41.0  MCV 89.3  --   PLT 309  --    Basic Metabolic Panel: Recent Labs  Lab 11/22/20 1537 11/22/20 1809  NA 133* 133*  K 3.9 4.0  CL 101  --   CO2 23  --   GLUCOSE 95  --   BUN 10  --   CREATININE 0.71  --   CALCIUM 10.0  --    GFR: Estimated Creatinine Clearance: 50.1 mL/min (by C-G formula based on SCr of 0.71 mg/dL). Recent Labs  Lab 11/22/20 1537 11/22/20 1747 11/22/20 2145  WBC 16.6*  --   --   LATICACIDVEN  --  1.3 0.9   Liver Function Tests: No results for input(s): AST, ALT, ALKPHOS, BILITOT, PROT, ALBUMIN in the last 168 hours. No results for input(s): LIPASE, AMYLASE in the last 168 hours. No results for input(s): AMMONIA in the last 168 hours. Coagulation Profile: No results for input(s): INR, PROTIME in the last 168 hours. Cardiac Enzymes:  No results for input(s): CKTOTAL, CKMB, CKMBINDEX, TROPONINI in the last 168 hours. BNP (last 3 results) No results for input(s): PROBNP in the last 8760 hours. HbA1C: No results for input(s): HGBA1C in the last 72 hours. CBG: No results for input(s): GLUCAP in the last 168 hours. Lipid Profile: No results for input(s): CHOL, HDL, LDLCALC, TRIG, CHOLHDL, LDLDIRECT in the last 72 hours. Thyroid Function Tests: No results for input(s): TSH, T4TOTAL, FREET4, T3FREE, THYROIDAB in the last 72 hours. Anemia Panel: No results for input(s): VITAMINB12, FOLATE, FERRITIN, TIBC, IRON, RETICCTPCT in the last 72 hours. Urine analysis:    Component Value Date/Time   COLORURINE YELLOW 07/21/2020 1613   APPEARANCEUR CLOUDY (A) 07/21/2020 1613   LABSPEC >1.030 (H) 07/21/2020 1613   PHURINE 5.5 07/21/2020 1613   GLUCOSEU NEGATIVE 07/21/2020 1613   HGBUR NEGATIVE 07/21/2020 1613   BILIRUBINUR NEGATIVE 07/21/2020 1613   KETONESUR NEGATIVE 07/21/2020 1613   PROTEINUR NEGATIVE 07/21/2020 1613   NITRITE NEGATIVE 07/21/2020 1613   LEUKOCYTESUR NEGATIVE 07/21/2020 1613    Radiological Exams on Admission: Personally reviewed  DG  Chest 2 View  Result Date: 11/22/2020 CLINICAL DATA:  Shortness of breath. Weakness. Altered mental status. EXAM: CHEST - 2 VIEW COMPARISON:  Radiograph 07/21/2020, CT 02/14/2016 FINDINGS: Background chronic bronchial thickening with underlying emphysema. There is increase in peripheral opacity in the right mid upper lung zone at site of prior scarring. Stable subpleural reticular opacity at the bases from prior chest radiograph. The heart is normal in size. Aortic atherosclerosis. No significant pleural effusion. No pneumothorax. IMPRESSION: 1. Slight increase in peripheral opacity in the right mid upper lung zone at site of prior scarring. This may represent pneumonia in this region, however recommend close clinical follow-up with PA and lateral views of the chest in 3-4 weeks to ensure resolution. 2. Background emphysema with bronchial thickening. Subpleural reticulation at the lung bases is stable from prior chest radiograph, and raises the possibility of underlying interstitial lung disease. Electronically Signed   By: Narda Rutherford M.D.   On: 11/22/2020 15:52   CT Angio Chest PE W and/or Wo Contrast  Result Date: 11/22/2020 CLINICAL DATA:  PE suspected, high prob.  Weakness EXAM: CT ANGIOGRAPHY CHEST WITH CONTRAST TECHNIQUE: Multidetector CT imaging of the chest was performed using the standard protocol during bolus administration of intravenous contrast. Multiplanar CT image reconstructions and MIPs were obtained to evaluate the vascular anatomy. CONTRAST:  28mL OMNIPAQUE IOHEXOL 350 MG/ML SOLN COMPARISON:  02/14/2016 FINDINGS: Cardiovascular: No filling defects in the pulmonary arteries to suggest pulmonary emboli. Heart is normal size. Tortuous, calcified aorta. Scattered coronary artery calcifications. Mediastinum/Nodes: No mediastinal, hilar, or axillary adenopathy. Trachea and esophagus are unremarkable. Thyroid unremarkable. Lungs/Pleura: Emphysema. Airspace opacity peripherally in the right  upper lobe, likely pneumonia but warrants follow-up. Scarring/fibrosis at the lung bases. No effusions. Upper Abdomen: Imaging into the upper abdomen demonstrates no acute findings. Musculoskeletal: Chest wall soft tissues are unremarkable. No acute bony abnormality. Review of the MIP images confirms the above findings. IMPRESSION: No evidence of pulmonary embolus. Airspace opacity peripherally in the right upper lobe concerning for pneumonia. Recommend follow-up after treatment to ensure resolution. Coronary artery disease. Aortic Atherosclerosis (ICD10-I70.0) and Emphysema (ICD10-J43.9). Electronically Signed   By: Charlett Nose M.D.   On: 11/22/2020 19:21    EKG: Independently reviewed. NSR, RBBB, Qtc , no prior EKGs available for comparison     Assessment and Plan:   Principal Problem:   CAP (community acquired pneumonia) Active Problems:  Emphysema lung (HCC)   Pulmonary fibrosis (HCC)   Rheumatoid arthritis (HCC)   Mild cognitive impairment   CAD (coronary artery disease)   HTN (hypertension)   Hypothyroidism    1.Right upper lobe pneumonia with sepsis POA: due to community acquired organisms vs aspiration. Will monitor response to current antibiotics. Patient meets sepsis criterion with fever, hypotension, tachypnea and leucocytosis with source of infection being RUL pneumonia. F/U blood cultures (fever+white count). COVID screen -ve.Check Urine strep Ag, legionella. Addendum: according to granddaughter patient at times chokes while drinking liquids,coughs while eating liquids at times, uses straws some times. Will get speech therapy for swallow eval   2. Acute hypoxic respiratory failure: POA. Secondary to Pneumonia with underlying COPD/ILD. Not sure of baseline O2 sats. Last PFTs in 2014 show FEV1/FVC % 69-74%. Will monitor progress with Rx of above. Currently requiring 3lits O2. Taper 02 as tolerated. Walking desat studies prior to d/c. May benefit from outpatient Pulmonary  f/u for repeat PFTs and inhaler therapy optimization.   3.  Mild hyponatremia: likely secondary to dehydration. Monitor response with gentle IVF (received LR@75 /Hr at [facility]), labs in am  4. COPD/emphysema: 2014 PFTs as above. Resume home inhaler therapy/nebs PRN  5. Rheumatoid arthritis: Basilar scarring reported on CT chest could be related to this/ILD. Hold Plaquenil/methotrexate until #1 resolved.  6. Hypothyroidism: resume synthroid  7. HTN, hyperlipidemia, CAD: resume home meds-asa, Norvasc at lower dose with holding parameters, statins.   8.Abnormal EKG: Likely RBBB chronic with underlying pulmonary issues. Will obtain echo.   9. Vitamin D deficiency: resume home meds  10. ?seizure disorder: patient denies this history, noted Keppra on home med list but last PCP note does not reflect this. Pharmacy to reconcile home meds.  Addendum: per grand-daughter, patient had a period of disorientation many years back and treated emprically for a short time. After that, neurology f/u ruled out seizures and discontinued Keppra  11. Mild cognitive impairment: Resume home therapy, supportive management. Oriented x 3 currently.  12. Dry eyes: ophthalmic drops as needed    DVT prophylaxis: Lovenox  COVID screen:neg  Code Status: Full code. Does not have a living will. Health care proxy would be Grand-daughter [name]  Patient/Family Communication: Discussed with patient and all questions answered to satisfaction.  Consults called: None Admission status :I certify that at the point of admission it is my clinical judgment that the patient will require inpatient hospital care spanning beyond 2 midnights from the point of admission due to high intensity of service and high frequency of surveillance required.Inpatient status is judged to be reasonable and necessary in order to provide the required intensity of service to ensure the patient's safety. The patient's presenting symptoms, physical exam  findings, and initial radiographic and laboratory data in the context of their chronic comorbidities is felt to place them at high risk for further clinical deterioration. The following factors support the patient status of inpatient : RUL PNA with PSI score 96 (class IV) and CURB-65 score 3 on presentation.      Alessandra Bevels MD [facility] Pager in [place]  If 7PM-7AM, please contact night-coverage [email or web address]   11/23/2020, 11:47 AM

## 2020-11-23 NOTE — ED Notes (Signed)
Communicated BP trend  to hospitalist, orders given for IVF, see MAR

## 2020-11-23 NOTE — Plan of Care (Signed)
  Problem: Clinical Measurements: Goal: Ability to maintain clinical measurements within normal limits will improve Outcome: Not Progressing Goal: Diagnostic test results will improve Outcome: Not Progressing Goal: Respiratory complications will improve Outcome: Not Progressing   Problem: Safety: Goal: Ability to remain free from injury will improve Outcome: Not Progressing   Problem: Skin Integrity: Goal: Risk for impaired skin integrity will decrease Outcome: Not Progressing

## 2020-11-24 ENCOUNTER — Inpatient Hospital Stay (HOSPITAL_COMMUNITY): Payer: Medicare PPO

## 2020-11-24 ENCOUNTER — Other Ambulatory Visit (HOSPITAL_COMMUNITY): Payer: Medicare PPO

## 2020-11-24 DIAGNOSIS — R9431 Abnormal electrocardiogram [ECG] [EKG]: Secondary | ICD-10-CM

## 2020-11-24 LAB — CBC
HCT: 37.5 % (ref 36.0–46.0)
Hemoglobin: 12.6 g/dL (ref 12.0–15.0)
MCH: 29.9 pg (ref 26.0–34.0)
MCHC: 33.6 g/dL (ref 30.0–36.0)
MCV: 89.1 fL (ref 80.0–100.0)
Platelets: 271 10*3/uL (ref 150–400)
RBC: 4.21 MIL/uL (ref 3.87–5.11)
RDW: 15.1 % (ref 11.5–15.5)
WBC: 14.8 10*3/uL — ABNORMAL HIGH (ref 4.0–10.5)
nRBC: 0 % (ref 0.0–0.2)

## 2020-11-24 LAB — LEGIONELLA PNEUMOPHILA SEROGP 1 UR AG: L. pneumophila Serogp 1 Ur Ag: NEGATIVE

## 2020-11-24 LAB — ECHOCARDIOGRAM COMPLETE
Area-P 1/2: 2.63 cm2
Height: 65 in
S' Lateral: 2.2 cm
Weight: 2528 oz

## 2020-11-24 MED ORDER — ENOXAPARIN SODIUM 40 MG/0.4ML IJ SOSY
40.0000 mg | PREFILLED_SYRINGE | INTRAMUSCULAR | Status: DC
  Administered 2020-11-24 – 2020-11-26 (×3): 40 mg via SUBCUTANEOUS
  Filled 2020-11-24 (×3): qty 0.4

## 2020-11-24 MED ORDER — ALBUTEROL SULFATE (2.5 MG/3ML) 0.083% IN NEBU
2.5000 mg | INHALATION_SOLUTION | Freq: Three times a day (TID) | RESPIRATORY_TRACT | Status: DC
  Administered 2020-11-24 – 2020-11-25 (×4): 2.5 mg via RESPIRATORY_TRACT
  Filled 2020-11-24 (×5): qty 3

## 2020-11-24 NOTE — Progress Notes (Signed)
Modified Barium Swallow Progress Note  Patient Details  Name: Tina Bishop MRN: [id number] Date of Birth: 1932/03/31  Today's Date: 11/24/2020  Modified Barium Swallow completed.  Full report located under Chart Review in the Imaging Section.  Brief recommendations include the following:  Clinical Impression  Patient presents with normal oropharyngeal swallowing function for age. Patient with top dentures but reports she is unable to tolerate wearing lower dentures, causing discomfort with dry/crunchy solids. Oral phase WFL however for mastication of moistened solids and all liquids. Patient with intermittent flash penetration of thin liquids and one episode of penetration which remained post swallow, clearing with subsequent independent dry swallows, all age appropriate. No dietary changes or f/u SLP services needed at this time. Patient appropriate to continue po diet, choosing softer items as needed for mastication.   Swallow Evaluation Recommendations       SLP Diet Recommendations: Regular solids;Thin liquid   Liquid Administration via: Cup;Straw   Medication Administration: Whole meds with liquid   Supervision: Patient able to self feed   Compensations: Slow rate;Small sips/bites   Postural Changes: Seated upright at 90 degrees   Oral Care Recommendations: Oral care BID      Dynastee Brummell MA, CCC-SLP   [name] 11/24/2020,9:54 AM

## 2020-11-24 NOTE — Progress Notes (Signed)
  Echocardiogram 2D Echocardiogram has been performed.  Tina Bishop 11/24/2020, 5:42 PM

## 2020-11-24 NOTE — Evaluation (Signed)
Physical Therapy Evaluation Patient Details Name: Tina Bishop MRN: [id number] DOB: 04/21/1932 Today's Date: 11/24/2020  History of Present Illness  [name], 87 y.o. female with PMH of HTN, hyperlipidemia, CAD, diastolic cardiomyopathy, COPD/emphysema not on home O2, hypothyroidism, rheumatoid arthritis, seizure disorder admitted with SOB due to sepsis PNA and acute hypoxic respiratory failure.  Clinical Impression  Patient presents with decreased mobility due to general weakness, decreased cardiorespiratory endurance, decreased balance and she will benefit from skilled PT in the acute setting and follow up HHPT at d/c.  She was not willing to walk due to chest pain, but assisted to recliner at bedside for upright positioning and to change bed.  She was independent at home cooking and cleaning.  Family present and supportive.  Would recommend assist for mobility when up on her feet initially for fall prevention.       Recommendations for follow up therapy are one component of a multi-disciplinary discharge planning process, led by the attending physician.  Recommendations may be updated based on patient status, additional functional criteria and insurance authorization.  Follow Up Recommendations Home health PT    Assistance Recommended at Discharge Intermittent Supervision/Assistance  Functional Status Assessment Patient has had a recent decline in their functional status and demonstrates the ability to make significant improvements in function in a reasonable and predictable amount of time.  Equipment Recommendations  Other (comment) (TBA, not sure if she has a walker already)    Recommendations for Other Services       Precautions / Restrictions Precautions Precautions: Fall Precaution Comments: watch O2 Restrictions Weight Bearing Restrictions: No      Mobility  Bed Mobility Overal bed mobility: Needs Assistance Bed Mobility: Supine to Sit     Supine to sit: Min  assist;HOB elevated     General bed mobility comments: pt reaching for hand hold assist as coming up to sit EOB    Transfers Overall transfer level: Needs assistance Equipment used: None Transfers: Sit to/from [employer] Sit to Stand: Min assist Stand pivot transfers: Min assist         General transfer comment: friend close and trying to assist, pt able to stand, but letting her friend lift her when she stood to replace pad in chair and new purewick after urinary incontinence when coughing    Ambulation/Gait                Stairs            Wheelchair Mobility    Modified Rankin (Stroke Patients Only)       Balance Overall balance assessment: Needs assistance   Sitting balance-Leahy Scale: Fair       Standing balance-Leahy Scale: Poor Standing balance comment: leaning on assistants in standing                             Pertinent Vitals/Pain Pain Assessment: No/denies pain Faces Pain Scale: Hurts little more Breathing: normal Pain Location: chest Pain Descriptors / Indicators: Sore Pain Intervention(s): Monitored during session;Patient requesting pain meds-RN notified;Limited activity within patient's tolerance    Home Living Family/patient expects to be discharged to:: Private residence Living Arrangements: Children (son and daughter in law)   Type of Home: Mobile home Home Access: Stairs to enter Entrance Stairs-Rails: Right Entrance Stairs-Number of Steps: 4   Home Layout: One level Home Equipment: Shower seat      Prior Function Prior Level of Function :  Independent/Modified Independent               ADLs Comments: was cooking at home     Hand Dominance        Extremity/Trunk Assessment   Upper Extremity Assessment Upper Extremity Assessment: Generalized weakness    Lower Extremity Assessment Lower Extremity Assessment: Generalized weakness    Cervical / Trunk Assessment Cervical /  Trunk Assessment: Kyphotic  Communication   Communication: HOH  Cognition Arousal/Alertness: Awake/alert Behavior During Therapy: Anxious Overall Cognitive Status: Impaired/Different from baseline Area of Impairment: Attention;Safety/judgement                   Current Attention Level: Selective     Safety/Judgement: Decreased awareness of safety     General Comments: not aware she urinated when coughing, some trouble focusing with friend and grandaughter in the room asking questions about meal preferences, friend anxious to help and pt questioning each about what they are asking        General Comments General comments (skin integrity, edema, etc.): on RA after up on EOB and talking, SpO2 down to 88% and pt c/o dizziness replaced O2 at 2LPM and cues for pursed lip breathing with SpO2 back to 94%.    Exercises     Assessment/Plan    PT Assessment Patient needs continued PT services  PT Problem List Decreased strength;Cardiopulmonary status limiting activity;Decreased mobility;Decreased balance;Decreased knowledge of use of DME       PT Treatment Interventions DME instruction;Balance training;Gait training;Stair training;Functional mobility training;Therapeutic activities;Patient/family education;Therapeutic exercise    PT Goals (Current goals can be found in the Care Plan section)  Acute Rehab PT Goals Patient Stated Goal: to go home ASAP PT Goal Formulation: With patient Time For Goal Achievement: 12/08/20 Potential to Achieve Goals: Good    Frequency Min 3X/week   Barriers to discharge        Co-evaluation               AM-PAC PT "6 Clicks" Mobility  Outcome Measure Help needed turning from your back to your side while in a flat bed without using bedrails?: None Help needed moving from lying on your back to sitting on the side of a flat bed without using bedrails?: A Little Help needed moving to and from a bed to a chair (including a wheelchair)?: A  Little Help needed standing up from a chair using your arms (e.g., wheelchair or bedside chair)?: A Little Help needed to walk in hospital room?: Total Help needed climbing 3-5 steps with a railing? : Total 6 Click Score: 15    End of Session   Activity Tolerance: Patient limited by fatigue Patient left: in chair;with call bell/phone within reach;with family/visitor present   PT Visit Diagnosis: Muscle weakness (generalized) (M62.81);Other abnormalities of gait and mobility (R26.89)    Time: 0315-9458 PT Time Calculation (min) (ACUTE ONLY): 27 min   Charges:   PT Evaluation $PT Eval Moderate Complexity: 1 Mod PT Treatments $Therapeutic Activity: 8-22 mins        Sheran Lawless, PT Acute Rehabilitation Services [phone] Office:[phone] 11/24/2020   Elray Mcgregor 11/24/2020, 11:40 AM

## 2020-11-24 NOTE — Evaluation (Signed)
Occupational Therapy Evaluation Patient Details Name: Tina Bishop MRN: [id number] DOB: 08/20/1932 Today's Date: 11/24/2020   History of Present Illness Tina Bishop, 87 y.o. female with PMH of HTN, hyperlipidemia, CAD, diastolic cardiomyopathy, COPD/emphysema not on home O2, hypothyroidism, rheumatoid arthritis, seizure disorder admitted with SOB due to sepsis PNA and acute hypoxic respiratory failure.   Clinical Impression   PTA patient reports independent and driving. Admitted for above and limited by problem list below, including generalized weakness, decreased activity tolerance.  Pt currently requires 2L supplemental O2 via [place], completing bed mobility with supervision, ADLs with up to min assist. Limited session today due to fatigue, and requesting to return back to bed.  Pt will benefit from continued OT services while admitted and after dc at [facility] level (pending progress) to optimize independence, safety and return to PLOF.  Will follow.      Recommendations for follow up therapy are one component of a multi-disciplinary discharge planning process, led by the attending physician.  Recommendations may be updated based on patient status, additional functional criteria and insurance authorization.   Follow Up Recommendations  Home health OT    Assistance Recommended at Discharge Intermittent Supervision/Assistance  Functional Status Assessment  Patient has had a recent decline in their functional status and demonstrates the ability to make significant improvements in function in a reasonable and predictable amount of time.  Equipment Recommendations  None recommended by OT    Recommendations for Other Services       Precautions / Restrictions Precautions Precautions: Fall Precaution Comments: watch O2 Restrictions Weight Bearing Restrictions: No      Mobility Bed Mobility Overal bed mobility: Needs Assistance Bed Mobility: Supine to Sit;Sit to Supine     Supine to  sit: Supervision Sit to supine: Supervision   General bed mobility comments: increased time with HOB elevated but no assist required    Transfers Overall transfer level: Needs assistance Equipment used: None Transfers: Lateral/Scoot Transfers Sit to Stand: Min assist Stand pivot transfers: Min assist        Lateral/Scoot Transfers: Min assist General transfer comment: pt declined standing due to fatigue, but scooting towards [facility] with supervision      Balance Overall balance assessment: Needs assistance   Sitting balance-Leahy Scale: Good       Standing balance-Leahy Scale: Poor Standing balance comment: leaning on assistants in standing                           ADL either performed or assessed with clinical judgement   ADL Overall ADL's : Needs assistance/impaired     Grooming: Set up;Sitting           Upper Body Dressing : Set up;Sitting   Lower Body Dressing: Minimal assistance;Sitting/lateral leans               Functional mobility during ADLs: Supervision/safety       Vision         Perception     Praxis      Pertinent Vitals/Pain Pain Assessment: No/denies pain Faces Pain Scale: Hurts little more Pain Location: chest Pain Descriptors / Indicators: Sore Pain Intervention(s): Monitored during session;Patient requesting pain meds-RN notified;Limited activity within patient's tolerance     Hand Dominance     Extremity/Trunk Assessment Upper Extremity Assessment Upper Extremity Assessment: Generalized weakness   Lower Extremity Assessment Lower Extremity Assessment: Defer to PT evaluation   Cervical / Trunk Assessment Cervical / Trunk Assessment:  Kyphotic   Communication Communication Communication: HOH   Cognition Arousal/Alertness: Awake/alert Behavior During Therapy: WFL for tasks assessed/performed Overall Cognitive Status: Within Functional Limits for tasks assessed Area of Impairment:  Attention;Safety/judgement                   Current Attention Level: Selective     Safety/Judgement: Decreased awareness of safety     General Comments: WFL for tasks assessed, limited session and continue assessment     General Comments  on 2L with VSS    Exercises     Shoulder Instructions      Home Living Family/patient expects to be discharged to:: Private residence Living Arrangements: Children (son and daughter in law)   Type of Home: Mobile home Home Access: Stairs to enter Entrance Stairs-Number of Steps: 4 Entrance Stairs-Rails: Right Home Layout: One level     Bathroom Shower/Tub: Producer, television/film/video: Standard     Home Equipment: Shower seat          Prior Functioning/Environment Prior Level of Function : Independent/Modified Independent             Mobility Comments: independent ADLs Comments: independent, cooking and driving        OT Problem List: Decreased strength;Decreased activity tolerance;Impaired balance (sitting and/or standing);Decreased knowledge of use of DME or AE;Decreased knowledge of precautions;Cardiopulmonary status limiting activity      OT Treatment/Interventions: Self-care/ADL training;Therapeutic exercise;DME and/or AE instruction;Therapeutic activities;Patient/family education;Balance training;Energy conservation    OT Goals(Current goals can be found in the care plan section) Acute Rehab OT Goals Patient Stated Goal: feel better, and get home OT Goal Formulation: With patient Time For Goal Achievement: 12/08/20 Potential to Achieve Goals: Good  OT Frequency: Min 2X/week   Barriers to D/C:            Co-evaluation              AM-PAC OT "6 Clicks" Daily Activity     Outcome Measure Help from another person eating meals?: None Help from another person taking care of personal grooming?: A Little Help from another person toileting, which includes using toliet, bedpan, or urinal?: A  Little Help from another person bathing (including washing, rinsing, drying)?: A Little Help from another person to put on and taking off regular upper body clothing?: A Little Help from another person to put on and taking off regular lower body clothing?: A Little 6 Click Score: 19   End of Session Equipment Utilized During Treatment: Oxygen Nurse Communication: Mobility status  Activity Tolerance: Patient limited by fatigue Patient left: in bed;with call bell/phone within reach;with bed alarm set  OT Visit Diagnosis: Other abnormalities of gait and mobility (R26.89);Muscle weakness (generalized) (M62.81)                Time: 6045-4098 OT Time Calculation (min): 8 min Charges:  OT General Charges $OT Visit: 1 Visit OT Evaluation $OT Eval Moderate Complexity: 1 Mod  Barry Brunner, OT Acute Rehabilitation Services Pager [phone] Office [phone]   Chancy Milroy 11/24/2020, 1:36 PM

## 2020-11-24 NOTE — Progress Notes (Signed)
PROGRESS NOTE    Tina Bishop  ZOX:[id number] DOB: 28-Apr-1932 DOA: 11/22/2020 PCP: Heron Nay, PA   Chief Complaint  Patient presents with   Shortness of Breath   Brief Narrative/Hospital Course:  Tina Bishop, 87 y.o. female with PMH of HTN, hyperlipidemia, CAD, diastolic cardiomyopathy, COPD/emphysema not on home O2, hypothyroidism, rheumatoid arthritis, seizure disorder presented initially to PCP office , then directed to [facility] ED for progressive shortness of breath x2 days, productive cough with phlegm no GI symptoms chest pain and also with choking episodes while eating at home. In the ED patient was febrile 100.8 blood pressure soft tachypneic hypoxic 80% on room air with leukocytosis chest x-ray/CTA showed no PE but emphysema and airspace opacity.  Fairly large right upper lobe likely pneumonia.  Scarring/fibrosis at the lung bases.  Placed on antibiotics and admitted. Seen by speech therapy due to concern for aspiration.   Subjective: Overnight T-max 100, on nasal cannula oxygen blood pressure 100-144  She was confused last night She is coughing with phlegm. Granddaughter at bedside.  Assessment & Plan:  Sepsis POA due to pneumonia Right upper lobe pneumonia Acute hypoxic aspiratory failure in the setting of emphysema/COPD ILD: Lab parameters are improving.  Repeat CBC.  Continue supplemental oxygen, strep pneumonia antigen negative follow Legionella antigen.  Continue with empiric ceftriaxone/azithromycin supportive measures. Recent Labs  Lab 11/22/20 1537 11/22/20 1747 11/22/20 2145 11/24/20 0759  WBC 16.6*  --   --  14.8*  LATICACIDVEN  --  1.3 0.9  --    Possible aspiration: at home patient choking with food Per granddaughter, speech following continue diet  Mild hyponatremia-resolved. Recent Labs  Lab 11/22/20 1537 11/22/20 1809 11/23/20 1159  NA 133* 133* 137    COPD/emphysema/Pulmonary fibrosis: Continue nebulizer.  CT chest shows lower lung  scaring.   Acute metabolic encephalopathy with mild cognitive impairment: Patient was confused paranoid last night but this morning she is at baseline.  Likely due to sepsis and pneumonia.  Continue supportive care delirium precaution , reorientation.  Hypertension Hyperlipidemia CAD: High sensitive troponin was negative.  No chest pain.  Blood pressure soft/stable.  Continue amlodipine aspirin and statin  Hypothyroidism: on home Synthroid  History of seizure disorder not on Keppra anymore discontinued by her neurology.  RA: Holding Plaquenil methotrexate until #1 resolves. DVT prophylaxis: enoxaparin (LOVENOX) injection 40 mg Start: 11/24/20 1000 Code Status:   Code Status: Full Code Family Communication: plan of care discussed with patient and family at bedside. Status is: Inpatient  Remains inpatient appropriate because: For ongoing management of pneumonia with IV antibiotics.   Currently not medically stable for discharge  Anticipate discharge over the weekend or early next week   Objective: Vitals last 24 hrs: Vitals:   11/23/20 1010 11/23/20 1134 11/23/20 2214 11/24/20 0615  BP: 134/78 (!) 114/57 (!) 144/105 (!) 106/46  Pulse: 81 79 (!) 101 91  Resp: (!) 23  Temp:  98 F (36.7 C) 100 F (37.8 C) 98.4 F (36.9 C)  TempSrc:  Oral Axillary Axillary  SpO2: 95% 96% 95% 95%  Weight:      Height:       Weight change:   Intake/Output Summary (Last 24 hours) at 11/24/2020 1031 Last data filed at 11/23/2020 1800 Gross per 24 hour  Intake 615 ml  Output --  Net 615 ml   Net IO Since Admission: 1,966.34 mL [11/24/20 1031]   Physical Examination: General exam: AA0X3,weak,older than stated age. HEENT:Oral mucosa moist,  Ear/Nose WNL grossly,dentition normal. Respiratory system: B/l diminisehd bs with mild wheezes, no use of accessory muscle, non tender. Cardiovascular system: S1 & S2 +,No JVD. Gastrointestinal system: Abdomen soft, NT,ND, BS+. Nervous  System:Alert, awake, moving extremities. Extremities: edema none, distal peripheral pulses palpable.  Skin: No rashes, no icterus. MSK: Normal muscle bulk, tone, power.  Medications reviewed:  Scheduled Meds:  albuterol  2.5 mg Nebulization TID   amLODipine  2.5 mg Oral Daily   aspirin EC  81 mg Oral Daily   azithromycin  500 mg Oral Daily   calcium carbonate  1 tablet Oral Q breakfast   cholecalciferol  1,000 Units Oral Daily   enoxaparin (LOVENOX) injection  40 mg Subcutaneous Q24H   folic acid  1 mg Oral Daily   influenza vaccine adjuvanted  0.5 mL Intramuscular Tomorrow-1000   levothyroxine  50 mcg Oral Q0600   loratadine  10 mg Oral Daily   pantoprazole  40 mg Oral Daily   pravastatin  20 mg Oral Daily   umeclidinium bromide  1 puff Inhalation Daily   Continuous Infusions:  cefTRIAXone (ROCEPHIN)  IV 2 g (11/23/20 1757)   Diet Order             Diet Heart Room service appropriate? Yes with Assist; Fluid consistency: Thin  Diet effective now                          Weight change:   Wt Readings from Last 3 Encounters:  11/22/20 71.7 kg  07/21/20 71.7 kg  01/14/16 77.1 kg     Consultants:see note  Procedures:see note Antimicrobials: Anti-infectives (From admission, onward)    Start     Dose/Rate Route Frequency Ordered Stop   11/23/20 1800  cefTRIAXone (ROCEPHIN) 2 g in sodium chloride 0.9 % 100 mL IVPB        2 g 200 mL/hr over 30 Minutes Intravenous Every 24 hours 11/23/20 1123 11/27/20 1759   11/23/20 1230  azithromycin (ZITHROMAX) tablet 500 mg        500 mg Oral Daily 11/23/20 1123 11/27/20 0959   11/23/20 1215  valACYclovir (VALTREX) tablet 500 mg  Status:  Discontinued        500 mg Oral Daily 11/23/20 1123 11/23/20 1140   11/22/20 1800  cefTRIAXone (ROCEPHIN) 1 g in sodium chloride 0.9 % 100 mL IVPB        1 g 200 mL/hr over 30 Minutes Intravenous  Once 11/22/20 1746 11/22/20 1928   11/22/20 1800  azithromycin (ZITHROMAX) 500 mg in sodium  chloride 0.9 % 250 mL IVPB        500 mg 250 mL/hr over 60 Minutes Intravenous  Once 11/22/20 1746 11/22/20 2034      Culture/Microbiology    Component Value Date/Time   SDES BLOOD LEFT ARM 01/14/2016 1600   SPECREQUEST BOTTLES DRAWN AEROBIC AND ANAEROBIC 5CC EACH 01/14/2016 1600   CULT  01/14/2016 1600    NO GROWTH 5 DAYS Performed at [facility]    REPTSTATUS 01/19/2016 FINAL 01/14/2016 1600    Other culture-see note  Unresulted Labs (From admission, onward)     Start     Ordered   12/01/20 0500  Creatinine, serum  (enoxaparin (LOVENOX)    CrCl >/= 30 ml/min)  Weekly,   R     Comments: while on enoxaparin therapy   Question:  Specimen collection method  Answer:  Lab=Lab collect   11/24/20 0914  11/25/20 0500  Basic metabolic panel  Tomorrow morning,   R       Question:  Specimen collection method  Answer:  Lab=Lab collect   11/24/20 0750   11/25/20 0500  CBC  Tomorrow morning,   R       Question:  Specimen collection method  Answer:  Lab=Lab collect   11/24/20 0750   11/23/20 1127  Legionella Pneumophila Serogp 1 Ur Ag  Once,   R        11/23/20 1126   11/22/20 1747  Blood culture (routine x 2)  BLOOD CULTURE X 2,   STAT,   Status:  Canceled      11/22/20 1747          Data Reviewed: I have personally reviewed following labs and imaging studies CBC: Recent Labs  Lab 11/22/20 1537 11/22/20 1809 11/24/20 0759  WBC 16.6*  --  14.8*  HGB 13.8 13.9 12.6  HCT 41.6 41.0 37.5  MCV 89.3  --  89.1  PLT 309  --  271   Basic Metabolic Panel: Recent Labs  Lab 11/22/20 1537 11/22/20 1809 11/23/20 1159  NA 133* 133* 137  K 3.9 4.0 3.5  CL 101  --  106  CO2 23  --  24  GLUCOSE 95  --  100*  BUN 10  --  6*  CREATININE 0.71  --  0.61  CALCIUM 10.0  --  9.6   GFR: Estimated Creatinine Clearance: 50.1 mL/min (by C-G formula based on SCr of 0.61 mg/dL). Liver Function Tests: No results for input(s): AST, ALT, ALKPHOS, BILITOT, PROT, ALBUMIN in the last  168 hours. No results for input(s): LIPASE, AMYLASE in the last 168 hours. No results for input(s): AMMONIA in the last 168 hours. Coagulation Profile: No results for input(s): INR, PROTIME in the last 168 hours. Cardiac Enzymes: No results for input(s): CKTOTAL, CKMB, CKMBINDEX, TROPONINI in the last 168 hours. BNP (last 3 results) No results for input(s): PROBNP in the last 8760 hours. HbA1C: No results for input(s): HGBA1C in the last 72 hours. CBG: No results for input(s): GLUCAP in the last 168 hours. Lipid Profile: No results for input(s): CHOL, HDL, LDLCALC, TRIG, CHOLHDL, LDLDIRECT in the last 72 hours. Thyroid Function Tests: No results for input(s): TSH, T4TOTAL, FREET4, T3FREE, THYROIDAB in the last 72 hours. Anemia Panel: No results for input(s): VITAMINB12, FOLATE, FERRITIN, TIBC, IRON, RETICCTPCT in the last 72 hours. Sepsis Labs: Recent Labs  Lab 11/22/20 1747 11/22/20 2145  LATICACIDVEN 1.3 0.9    Recent Results (from the past 240 hour(s))  Resp Panel by RT-PCR (Flu A&B, Covid) Nasopharyngeal Swab     Status: None   Collection Time: 11/22/20  6:12 PM   Specimen: Nasopharyngeal Swab; Nasopharyngeal(NP) swabs in vial transport medium  Result Value Ref Range Status   SARS Coronavirus 2 by RT PCR NEGATIVE NEGATIVE Final    Comment: (NOTE) SARS-CoV-2 target nucleic acids are NOT DETECTED.  The SARS-CoV-2 RNA is generally detectable in upper respiratory specimens during the acute phase of infection. The lowest concentration of SARS-CoV-2 viral copies this assay can detect is 138 copies/mL. A negative result does not preclude SARS-Cov-2 infection and should not be used as the sole basis for treatment or other patient management decisions. A negative result may occur with  improper specimen collection/handling, submission of specimen other than nasopharyngeal swab, presence of viral mutation(s) within the areas targeted by this assay, and inadequate number of  viral copies(<138 copies/mL). A  negative result must be combined with clinical observations, patient history, and epidemiological information. The expected result is Negative.  Fact Sheet for Patients:  BloggerCourse.com  Fact Sheet for Healthcare Providers:  SeriousBroker.it  This test is no t yet approved or cleared by the [place] FDA and  has been authorized for detection and/or diagnosis of SARS-CoV-2 by FDA under an Emergency Use Authorization (EUA). This EUA will remain  in effect (meaning this test can be used) for the duration of the COVID-19 declaration under Section 564(b)(1) of the Act, 21 U.S.C.section 360bbb-3(b)(1), unless the authorization is terminated  or revoked sooner.       Influenza A by PCR NEGATIVE NEGATIVE Final   Influenza B by PCR NEGATIVE NEGATIVE Final    Comment: (NOTE) The Xpert Xpress SARS-CoV-2/FLU/RSV plus assay is intended as an aid in the diagnosis of influenza from Nasopharyngeal swab specimens and should not be used as a sole basis for treatment. Nasal washings and aspirates are unacceptable for Xpert Xpress SARS-CoV-2/FLU/RSV testing.  Fact Sheet for Patients: BloggerCourse.com  Fact Sheet for Healthcare Providers: SeriousBroker.it  This test is not yet approved or cleared by the [place] FDA and has been authorized for detection and/or diagnosis of SARS-CoV-2 by FDA under an Emergency Use Authorization (EUA). This EUA will remain in effect (meaning this test can be used) for the duration of the COVID-19 declaration under Section 564(b)(1) of the Act, 21 U.S.C. section 360bbb-3(b)(1), unless the authorization is terminated or revoked.  Performed at [facility], [address] [place], [place] [address]      Radiology Studies: DG Chest 2 View  Result Date: 11/22/2020 CLINICAL DATA:  Shortness of breath.  Weakness. Altered mental status. EXAM: CHEST - 2 VIEW COMPARISON:  Radiograph 07/21/2020, CT 02/14/2016 FINDINGS: Background chronic bronchial thickening with underlying emphysema. There is increase in peripheral opacity in the right mid upper lung zone at site of prior scarring. Stable subpleural reticular opacity at the bases from prior chest radiograph. The heart is normal in size. Aortic atherosclerosis. No significant pleural effusion. No pneumothorax. IMPRESSION: 1. Slight increase in peripheral opacity in the right mid upper lung zone at site of prior scarring. This may represent pneumonia in this region, however recommend close clinical follow-up with PA and lateral views of the chest in 3-4 weeks to ensure resolution. 2. Background emphysema with bronchial thickening. Subpleural reticulation at the lung bases is stable from prior chest radiograph, and raises the possibility of underlying interstitial lung disease. Electronically Signed   By: Narda Rutherford M.D.   On: 11/22/2020 15:52   CT Angio Chest PE W and/or Wo Contrast  Result Date: 11/22/2020 CLINICAL DATA:  PE suspected, high prob.  Weakness EXAM: CT ANGIOGRAPHY CHEST WITH CONTRAST TECHNIQUE: Multidetector CT imaging of the chest was performed using the standard protocol during bolus administration of intravenous contrast. Multiplanar CT image reconstructions and MIPs were obtained to evaluate the vascular anatomy. CONTRAST:  26mL OMNIPAQUE IOHEXOL 350 MG/ML SOLN COMPARISON:  02/14/2016 FINDINGS: Cardiovascular: No filling defects in the pulmonary arteries to suggest pulmonary emboli. Heart is normal size. Tortuous, calcified aorta. Scattered coronary artery calcifications. Mediastinum/Nodes: No mediastinal, hilar, or axillary adenopathy. Trachea and esophagus are unremarkable. Thyroid unremarkable. Lungs/Pleura: Emphysema. Airspace opacity peripherally in the right upper lobe, likely pneumonia but warrants follow-up. Scarring/fibrosis at the  lung bases. No effusions. Upper Abdomen: Imaging into the upper abdomen demonstrates no acute findings. Musculoskeletal: Chest wall soft tissues are unremarkable. No acute bony abnormality. Review  of the MIP images confirms the above findings. IMPRESSION: No evidence of pulmonary embolus. Airspace opacity peripherally in the right upper lobe concerning for pneumonia. Recommend follow-up after treatment to ensure resolution. Coronary artery disease. Aortic Atherosclerosis (ICD10-I70.0) and Emphysema (ICD10-J43.9). Electronically Signed   By: Charlett Nose M.D.   On: 11/22/2020 19:21     LOS: 1 day   Lanae Boast, MD [facility]  11/24/2020, 10:31 AM

## 2020-11-25 DIAGNOSIS — I2583 Coronary atherosclerosis due to lipid rich plaque: Secondary | ICD-10-CM

## 2020-11-25 DIAGNOSIS — I2510 Atherosclerotic heart disease of native coronary artery without angina pectoris: Secondary | ICD-10-CM

## 2020-11-25 LAB — BASIC METABOLIC PANEL
Anion gap: 9 (ref 5–15)
BUN: 12 mg/dL (ref 8–23)
CO2: 21 mmol/L — ABNORMAL LOW (ref 22–32)
Calcium: 9.5 mg/dL (ref 8.9–10.3)
Chloride: 104 mmol/L (ref 98–111)
Creatinine, Ser: 0.74 mg/dL (ref 0.44–1.00)
GFR, Estimated: >60 mL/min (ref >60)
Glucose, Bld: 97 mg/dL (ref 70–99)
Potassium: 3.4 mmol/L — ABNORMAL LOW (ref 3.5–5.1)
Sodium: 134 mmol/L — ABNORMAL LOW (ref 135–145)

## 2020-11-25 LAB — CBC
HCT: 37.2 % (ref 36.0–46.0)
Hemoglobin: 12.3 g/dL (ref 12.0–15.0)
MCH: 29.1 pg (ref 26.0–34.0)
MCHC: 33.1 g/dL (ref 30.0–36.0)
MCV: 87.9 fL (ref 80.0–100.0)
Platelets: 339 10*3/uL (ref 150–400)
RBC: 4.23 MIL/uL (ref 3.87–5.11)
RDW: 15.1 % (ref 11.5–15.5)
WBC: 13.5 10*3/uL — ABNORMAL HIGH (ref 4.0–10.5)
nRBC: 0 % (ref 0.0–0.2)

## 2020-11-25 MED ORDER — IPRATROPIUM-ALBUTEROL 0.5-2.5 (3) MG/3ML IN SOLN: 3.0000 mL | RESPIRATORY_TRACT | Status: DC | PRN

## 2020-11-25 MED ORDER — POTASSIUM CHLORIDE CRYS ER 20 MEQ PO TBCR
40.0000 meq | EXTENDED_RELEASE_TABLET | Freq: Once | ORAL | Status: AC
  Administered 2020-11-25: 40 meq via ORAL
  Filled 2020-11-25: qty 2

## 2020-11-25 MED ORDER — IPRATROPIUM-ALBUTEROL 0.5-2.5 (3) MG/3ML IN SOLN
3.0000 mL | Freq: Two times a day (BID) | RESPIRATORY_TRACT | Status: DC
  Administered 2020-11-25: 3 mL via RESPIRATORY_TRACT
  Filled 2020-11-25: qty 3

## 2020-11-25 MED ORDER — IPRATROPIUM-ALBUTEROL 0.5-2.5 (3) MG/3ML IN SOLN
3.0000 mL | Freq: Four times a day (QID) | RESPIRATORY_TRACT | Status: DC
Start: 2020-11-25 — End: 2020-11-25

## 2020-11-25 MED ORDER — MOMETASONE FURO-FORMOTEROL FUM 200-5 MCG/ACT IN AERO
2.0000 | INHALATION_SPRAY | Freq: Two times a day (BID) | RESPIRATORY_TRACT | Status: DC
  Administered 2020-11-25 – 2020-11-26 (×2): 2 via RESPIRATORY_TRACT
  Filled 2020-11-25: qty 8.8

## 2020-11-25 NOTE — Progress Notes (Signed)
BP 106/43 held Norvasc, MD made aware.

## 2020-11-25 NOTE — Plan of Care (Signed)
  Problem: Clinical Measurements: Goal: Ability to maintain clinical measurements within normal limits will improve Outcome: Progressing Goal: Diagnostic test results will improve Outcome: Progressing Goal: Respiratory complications will improve Outcome: Progressing   Problem: Safety: Goal: Ability to remain free from injury will improve Outcome: Progressing   Problem: Skin Integrity: Goal: Risk for impaired skin integrity will decrease Outcome: Progressing

## 2020-11-25 NOTE — Progress Notes (Signed)
PROGRESS NOTE    Tina Bishop  [id number] DOB: Jun 29, 1932 DOA: 11/22/2020 PCP: Rich Fuchs, PA    Brief Narrative:  Tina Bishop was admitted to the hospital with the working diagnosis of community acquired pneumonia complicated with sepsis present on admission.   87 year old female past medical history for hypertension, dyslipidemia, diastolic heart failure, coronary artery disease, COPD/emphysema, hypothyroidism, seizures and rheumatoid arthritis who presented with dyspnea and cough.  Patient reported 2 days of progressive dyspnea, associated with productive cough but no fevers or chills.  She was seen at her primary care provider office, she was referred to the ED for further evaluation.  On her initial physical examination her temperature was 100.8 F, blood pressure 90/47, 143/80, respirate 20-36, oxygenation 88% on room air.  Her lungs had no wheezing, heart S1-S2, present, rhythmic, abdomen soft, no lower extremity edema.  Sodium 133, potassium 3.9, chloride 101, bicarb 23, glucose 95, BUN 10, creatinine 0.71, white count 16.6, hemoglobin 13.8, hematocrit 41.6, platelets 309.  Venous pH 7.45, PCO2 30.7.  SARS COVID-19 negative.  Chest radiograph with right upper lobe peripheral interstitial infiltrate, bilateral increased lung markings at bases.  CT chest negative for pulmonary embolism.  Right upper lobe opacity.  Positive emphysematous changes.  Scarring/fibrosis at lung bases.  EKG 96 bpm, normal axis, right bundle branch block, sinus rhythm, no significant ST segment or T wave changes.  Patient was placed on antibiotic therapy, supplemental oxygen per nasal cannula and bronchodilators.   Assessment & Plan:   Principal Problem:   CAP (community acquired pneumonia) Active Problems:   Pulmonary fibrosis (HCC)   Mild cognitive impairment   CAD (coronary artery disease)   HTN (hypertension)   Emphysema lung (HCC)   Hypothyroidism   Rheumatoid arthritis  ([place])   Acute hypoxemic respiratory failure due to right upper lobe pneumonia, in the setting of COPD (emphysema), lower lobes pulmonary fibrosis.  Dyspnea continue to improve, but continue to be very weak and deconditioned.  Her oxygenation is 93% on room air.  Wbc is down to 13,5 and she has been afebrile. Blood cultures with no growth.   Plan to continue bronchodilator therapy with duoneb, scheduled and as needed. Continue with antitussive agents and airway clearing techniques.  Patient will need home health services, per PT and Ot.  Hold on systemic steroids, but continue with inhaled steroids.   2. Hyponatremia and hypokalemia stable renal function with serum cr at 0.74, K is 3,4 and serum Na is 134. Add 40 meq Kcl today and follow up renal function in am. Consult nutrition for recommendations.  3. Metabolic encephalopathy in the setting of mild cognitive impairment. Seizures.  Patient with no agitation, continue neuro checks per unit protocol.   4. HTN/ dyslipidemia blood pressure has been low, will hold on amlodipine for now to prevent hypotension. Continue with statin therapy.   5. Hypothyroid continue with levothyroxine   Patient continue to be at high risk for worsening pneumonia   Status is: Inpatient  Remains inpatient appropriate because: IV antibiotics    DVT prophylaxis: Enoxaparin   Code Status:    full  Family Communication:    Unable to reach her family I left a message on voicemail.   Antimicrobials:  Ceftriaxone and azithromycin     Subjective: Patient with improvement in dyspnea, no chest pain, no nausea or vomiting.   Objective: Vitals:   11/24/20 2155 11/25/20 0638 11/25/20 0745 11/25/20 0855  BP:  (!) 123/51  (!) 106/43  Pulse:  91 88 96  Resp:  (!) 22 (!) 28 (!) 22  Temp:  98.1 F (36.7 C)    TempSrc:  Axillary    SpO2: 91% 94% 95% 93%  Weight:      Height:        Intake/Output Summary (Last 24 hours) at 11/25/2020 1041 Last data filed  at 11/25/2020 0600 Gross per 24 hour  Intake 300 ml  Output 1300 ml  Net -1000 ml   Filed Weights   11/22/20 1519  Weight: 71.7 kg    Examination:   General: Not in pain or dyspnea, deconditioned  Neurology: Awake and alert, non focal  E ENT: no pallor, no icterus, oral mucosa moist Cardiovascular: No JVD. S1-S2 present, rhythmic, no gallops, rubs, or murmurs. No lower extremity edema. Pulmonary: positive breath sounds bilaterally, with no wheezing, or rhonchi positive scattered rales at bases. Gastrointestinal. Abdomen soft and non tender Skin. No rashes Musculoskeletal: no joint deformities     Data Reviewed: I have personally reviewed following labs and imaging studies  CBC: Recent Labs  Lab 11/22/20 1537 11/22/20 1809 11/24/20 0759 11/25/20 0430  WBC 16.6*  --  14.8* 13.5*  HGB 13.8 13.9 12.6 12.3  HCT 41.6 41.0 37.5 37.2  MCV 89.3  --  89.1 87.9  PLT 309  --  271 [id number]   Basic Metabolic Panel: Recent Labs  Lab 11/22/20 1537 11/22/20 1809 11/23/20 1159 11/25/20 0430  NA 133* 133* 137 134*  K 3.9 4.0 3.5 3.4*  CL 101  --  106 104  CO2 23  --  24 21*  GLUCOSE 95  --  100* 97  BUN 10  --  6* 12  CREATININE 0.71  --  0.61 0.74  CALCIUM 10.0  --  9.6 9.5   GFR: Estimated Creatinine Clearance: 50.1 mL/min (by C-G formula based on SCr of 0.74 mg/dL). Liver Function Tests: No results for input(s): AST, ALT, ALKPHOS, BILITOT, PROT, ALBUMIN in the last 168 hours. No results for input(s): LIPASE, AMYLASE in the last 168 hours. No results for input(s): AMMONIA in the last 168 hours. Coagulation Profile: No results for input(s): INR, PROTIME in the last 168 hours. Cardiac Enzymes: No results for input(s): CKTOTAL, CKMB, CKMBINDEX, TROPONINI in the last 168 hours. BNP (last 3 results) No results for input(s): PROBNP in the last 8760 hours. HbA1C: No results for input(s): HGBA1C in the last 72 hours. CBG: No results for input(s): GLUCAP in the last 168  hours. Lipid Profile: No results for input(s): CHOL, HDL, LDLCALC, TRIG, CHOLHDL, LDLDIRECT in the last 72 hours. Thyroid Function Tests: No results for input(s): TSH, T4TOTAL, FREET4, T3FREE, THYROIDAB in the last 72 hours. Anemia Panel: No results for input(s): VITAMINB12, FOLATE, FERRITIN, TIBC, IRON, RETICCTPCT in the last 72 hours.    Radiology Studies: I have reviewed all of the imaging during this hospital visit personally     Scheduled Meds:  albuterol  2.5 mg Nebulization TID   amLODipine  2.5 mg Oral Daily   aspirin EC  81 mg Oral Daily   azithromycin  500 mg Oral Daily   calcium carbonate  1 tablet Oral Q breakfast   cholecalciferol  1,000 Units Oral Daily   enoxaparin (LOVENOX) injection  40 mg Subcutaneous [id number]   folic acid  1 mg Oral Daily   influenza vaccine adjuvanted  0.5 mL Intramuscular Tomorrow-1000   levothyroxine  50 mcg Oral Q0600   loratadine  10 mg Oral Daily   pantoprazole  40 mg Oral Daily   pravastatin  20 mg Oral Daily   umeclidinium bromide  1 puff Inhalation Daily   Continuous Infusions:  cefTRIAXone (ROCEPHIN)  IV 2 g (11/24/20 1612)     LOS: 2 days        Evelene Roussin Annett Gula, MD

## 2020-11-26 LAB — CBC
HCT: 39.2 % (ref 36.0–46.0)
Hemoglobin: 12.7 g/dL (ref 12.0–15.0)
MCH: 28.9 pg (ref 26.0–34.0)
MCHC: 32.4 g/dL (ref 30.0–36.0)
MCV: 89.3 fL (ref 80.0–100.0)
Platelets: 310 10*3/uL (ref 150–400)
RBC: 4.39 MIL/uL (ref 3.87–5.11)
RDW: 15.2 % (ref 11.5–15.5)
WBC: 10.3 10*3/uL (ref 4.0–10.5)
nRBC: 0 % (ref 0.0–0.2)

## 2020-11-26 LAB — BASIC METABOLIC PANEL
Anion gap: 7 (ref 5–15)
BUN: 11 mg/dL (ref 8–23)
CO2: 21 mmol/L — ABNORMAL LOW (ref 22–32)
Calcium: 9.5 mg/dL (ref 8.9–10.3)
Chloride: 106 mmol/L (ref 98–111)
Creatinine, Ser: 0.69 mg/dL (ref 0.44–1.00)
GFR, Estimated: >60 mL/min (ref >60)
Glucose, Bld: 97 mg/dL (ref 70–99)
Potassium: 3.9 mmol/L (ref 3.5–5.1)
Sodium: 134 mmol/L — ABNORMAL LOW (ref 135–145)

## 2020-11-26 MED ORDER — AMOXICILLIN-POT CLAVULANATE 875-125 MG PO TABS: 1.0000 | ORAL_TABLET | Freq: Two times a day (BID) | ORAL | 0 refills | Status: AC

## 2020-11-26 MED ORDER — AMOXICILLIN-POT CLAVULANATE 875-125 MG PO TABS
1.0000 | ORAL_TABLET | Freq: Two times a day (BID) | ORAL | Status: DC
  Administered 2020-11-26: 1 via ORAL
  Filled 2020-11-26: qty 1

## 2020-11-26 NOTE — Discharge Summary (Signed)
Physician Discharge Summary  Tina Bishop [id number] DOB: 03/23/1932 DOA: 11/22/2020  PCP: Rich Fuchs, PA  Admit date: 11/22/2020 Discharge date: 11/26/2020  Admitted From: Home  Disposition:  Home   Recommendations for Outpatient Follow-up and new medication changes:  Follow up with Scherrie Gerlach PA in 7 to 10 days.  Continue antibiotic therapy with Augmentin for 3 more days.   Home Health: yes   Equipment/Devices: no    Discharge Condition: stable  CODE STATUS: full  Diet recommendation:  heart healthy   Brief/Interim Summary: Tina Bishop was admitted to the hospital with the working diagnosis of community acquired pneumonia complicated with sepsis present on admission.    87 year old female past medical history for hypertension, dyslipidemia, diastolic heart failure, coronary artery disease, COPD/emphysema, hypothyroidism, seizures and rheumatoid arthritis who presented with dyspnea and cough.  Patient reported 2 days of progressive dyspnea, associated with productive cough but no fevers or chills.  She was seen at her primary care provider office, she was referred to the ED for further evaluation.  On her initial physical examination her temperature was 100.8 F, blood pressure 90/47, 143/80, respiratory rate 20-36, oxygenation 88% on room air.  Her lungs had no wheezing, heart S1-S2, present, rhythmic, abdomen soft, no lower extremity edema.   Sodium 133, potassium 3.9, chloride 101, bicarb 23, glucose 95, BUN 10, creatinine 0.71, white count 16.6, hemoglobin 13.8, hematocrit 41.6, platelets 309.  Venous pH 7.45, PCO2 30.7.  SARS COVID-19 negative.   Chest radiograph with right upper lobe peripheral interstitial infiltrate, bilateral increased lung markings at bases.   CT chest negative for pulmonary embolism.  Right upper lobe opacity.  Positive emphysematous changes.  Scarring/fibrosis at lung bases.   EKG 96 bpm, normal axis, right bundle branch block, sinus rhythm,  no significant ST segment or T wave changes.   Patient was placed on antibiotic therapy, supplemental oxygen per nasal cannula and bronchodilators.  Her symptoms improved and continue Augmentin for antibiotic therapy for 3 more days. Follow up as outpatient.   Acute hypoxemic respiratory failure due to right upper lobe pneumonia, in the setting of COPD/emphysema, lower lobes pulmonary fibrosis.  Patient was admitted to the medical ward, she was placed on a remote telemetry monitor.  Received supplemental oxygen per nasal cannula, antibiotic therapy with ceftriaxone and azithromycin. Aggressive bronchodilators, inhaled corticosteroids, and airway clearance techniques with flutter valve incentive spirometer.  Her respiratory symptoms improved, patient will complete antibiotic therapy with Augmentin for 3 more days. Continue bronchodilator therapy at home.  2.  Hyponatremia and hypokalemia.  Patient received supportive medical therapy, her electrolytes were corrected. At discharge sodium 134, potassium 3.9, chloride 106, bicarb 21, glucose 97, BUN 11, creatinine 0.69.  Patient is tolerating p.o. diet adequately.  3.  Metabolic encephalopathy in the setting of mild cognitive impairment.  Seizures.  Patient received supportive medical therapy, no agitation. No active seizures, continue with keppra.   4.  Hypertension/dyslipidemia.  Her blood pressure remained well controlled with amlodipine. Continue statin therapy.  5.  Hypothyroidism.  Continue with levothyroxine.  6. Rheumatoid arthritis. Continue with hydroxychloroquine and methotrexate.   Discharge Diagnoses:  Principal Problem:   CAP (community acquired pneumonia) Active Problems:   Pulmonary fibrosis (HCC)   Mild cognitive impairment   CAD (coronary artery disease)   HTN (hypertension)   Emphysema lung (HCC)   Hypothyroidism   Rheumatoid arthritis ([place])    Discharge Instructions   Allergies as of 11/26/2020        Reactions  Prednisone    Sulfa Antibiotics Rash        Medication List     STOP taking these medications    acetaminophen-codeine 300-30 MG tablet Commonly known as: TYLENOL #3       TAKE these medications    albuterol 108 (90 Base) MCG/ACT inhaler Commonly known as: VENTOLIN HFA Inhale 2 puffs into the lungs every 6 (six) hours as needed for wheezing.   amLODipine 10 MG tablet Commonly known as: NORVASC Take 10 mg by mouth daily.   amoxicillin-clavulanate 875-125 MG tablet Commonly known as: AUGMENTIN Take 1 tablet by mouth every 12 (twelve) hours for 3 days.   aspirin 81 MG tablet Take 81 mg by mouth daily.   CALCIUM PO Take 1 tablet by mouth daily.   cholecalciferol 1000 units tablet Commonly known as: VITAMIN D Take 1,000 Units by mouth daily.   clobetasol 0.05 % external solution Commonly known as: TEMOVATE Apply topically 2 (two) times daily.   folic acid 1 MG tablet Commonly known as: FOLVITE Take 1 mg by mouth daily.   hydroxychloroquine 200 MG tablet Commonly known as: PLAQUENIL Take 200 mg by mouth daily.   ketorolac 0.5 % ophthalmic solution Commonly known as: ACULAR Apply 0.5 drops to eye daily.   levETIRAcetam 500 MG 24 hr tablet Commonly known as: KEPPRA XR Take 500 mg by mouth daily.   levothyroxine 50 MCG tablet Commonly known as: SYNTHROID Take 50 mcg by mouth daily before breakfast.   loratadine 10 MG tablet Commonly known as: CLARITIN Take 10 mg by mouth daily.   methotrexate 2.5 MG tablet Commonly known as: RHEUMATREX Take 17.5 mg by mouth once a week. Caution:Chemotherapy. Protect from light.   nystatin cream Commonly known as: MYCOSTATIN Apply 100,000 application topically daily.   omeprazole 20 MG capsule Commonly known as: PRILOSEC Take 20 mg by mouth daily.   pravastatin 20 MG tablet Commonly known as: PRAVACHOL Take 20 mg by mouth daily.   tiotropium 18 MCG inhalation capsule Commonly known as:  SPIRIVA Place 18 mcg into inhaler and inhale daily.   triamcinolone cream 0.1 % Commonly known as: KENALOG Apply 1 application topically 2 (two) times daily.   valACYclovir 500 MG tablet Commonly known as: VALTREX Take 500 mg by mouth daily.   Vigamox 0.5 % ophthalmic solution Generic drug: moxifloxacin Apply 0.5 drops to eye daily.        Allergies  Allergen Reactions   Prednisone    Sulfa Antibiotics Rash      Procedures/Studies: DG Chest 2 View  Result Date: 11/22/2020 CLINICAL DATA:  Shortness of breath. Weakness. Altered mental status. EXAM: CHEST - 2 VIEW COMPARISON:  Radiograph 07/21/2020, CT 02/14/2016 FINDINGS: Background chronic bronchial thickening with underlying emphysema. There is increase in peripheral opacity in the right mid upper lung zone at site of prior scarring. Stable subpleural reticular opacity at the bases from prior chest radiograph. The heart is normal in size. Aortic atherosclerosis. No significant pleural effusion. No pneumothorax. IMPRESSION: 1. Slight increase in peripheral opacity in the right mid upper lung zone at site of prior scarring. This may represent pneumonia in this region, however recommend close clinical follow-up with PA and lateral views of the chest in 3-4 weeks to ensure resolution. 2. Background emphysema with bronchial thickening. Subpleural reticulation at the lung bases is stable from prior chest radiograph, and raises the possibility of underlying interstitial lung disease. Electronically Signed   By: Narda Rutherford M.D.   On: 11/22/2020 15:52  CT Angio Chest PE W and/or Wo Contrast  Result Date: 11/22/2020 CLINICAL DATA:  PE suspected, high prob.  Weakness EXAM: CT ANGIOGRAPHY CHEST WITH CONTRAST TECHNIQUE: Multidetector CT imaging of the chest was performed using the standard protocol during bolus administration of intravenous contrast. Multiplanar CT image reconstructions and MIPs were obtained to evaluate the vascular  anatomy. CONTRAST:  51mL OMNIPAQUE IOHEXOL 350 MG/ML SOLN COMPARISON:  02/14/2016 FINDINGS: Cardiovascular: No filling defects in the pulmonary arteries to suggest pulmonary emboli. Heart is normal size. Tortuous, calcified aorta. Scattered coronary artery calcifications. Mediastinum/Nodes: No mediastinal, hilar, or axillary adenopathy. Trachea and esophagus are unremarkable. Thyroid unremarkable. Lungs/Pleura: Emphysema. Airspace opacity peripherally in the right upper lobe, likely pneumonia but warrants follow-up. Scarring/fibrosis at the lung bases. No effusions. Upper Abdomen: Imaging into the upper abdomen demonstrates no acute findings. Musculoskeletal: Chest wall soft tissues are unremarkable. No acute bony abnormality. Review of the MIP images confirms the above findings. IMPRESSION: No evidence of pulmonary embolus. Airspace opacity peripherally in the right upper lobe concerning for pneumonia. Recommend follow-up after treatment to ensure resolution. Coronary artery disease. Aortic Atherosclerosis (ICD10-I70.0) and Emphysema (ICD10-J43.9). Electronically Signed   By: Charlett Nose M.D.   On: 11/22/2020 19:21   DG Swallowing Func-Speech Pathology  Result Date: 11/24/2020 Table formatting from the original result was not included. Objective Swallowing Evaluation: Type of Study: MBS-Modified Barium Swallow Study  Patient Details Name: Tina Bishop MRN: [id number] Date of Birth: 1932/12/30 Today's Date: 11/24/2020 Time: SLP Start Time (ACUTE ONLY): 0940 -SLP Stop Time (ACUTE ONLY): 0948 SLP Time Calculation (min) (ACUTE ONLY): 8 min Past Medical History: Past Medical History: Diagnosis Date  Altered mental status   CAD (coronary artery disease)   COPD (chronic obstructive pulmonary disease) (HCC)   Diastolic dysfunction   Emphysema with both acute and chronic bronchitis (HCC)   Hyperlipidemia   Hypothyroidism   Osteoporosis   Rheumatoid arthritis(714.0)   Vitamin D deficiency  Past Surgical History: Past  Surgical History: Procedure Laterality Date  ABDOMINAL HYSTERECTOMY  06-1984 HPI: 87yo female admitted 11/22/20 with dyspnea, cough. PMH:HTN, HLD, CAD, diastolic cardiomyopathy, COPD/emphysema, hypothyroidism, RA, seizure disorder.  Subjective: Pt resting in bed. Coughing. No family present Assessment / Plan / Recommendation CHL IP CLINICAL IMPRESSIONS 11/24/2020 Clinical Impression Patient presents with normal oropharyngeal swallowing function for age. Patient with top dentures but reports she is unable to tolerate wearing lower dentures, causing discomfort with dry/crunchy solids. Oral phase WFL however for mastication of moistened solids and all liquids. Patient with intermittent flash penetration of thin liquids and one episode of penetration which remained post swallow, clearing with subsequent independent dry swallows, all age appropriate. No dietary changes or f/u SLP services needed at this time. Patient appropriate to continue po diet, choosing softer items as needed for mastication. SLP Visit Diagnosis Dysphagia, unspecified (R13.10) Attention and concentration deficit following -- Frontal lobe and executive function deficit following -- Impact on safety and function --   CHL IP TREATMENT RECOMMENDATION 11/24/2020 Treatment Recommendations No treatment recommended at this time   Prognosis 11/23/2020 Prognosis for Safe Diet Advancement Fair Barriers to Reach Goals -- Barriers/Prognosis Comment -- CHL IP DIET RECOMMENDATION 11/24/2020 SLP Diet Recommendations Regular solids;Thin liquid Liquid Administration via Cup;Straw Medication Administration Whole meds with liquid Compensations Slow rate;Small sips/bites Postural Changes Seated upright at 90 degrees   CHL IP OTHER RECOMMENDATIONS 11/24/2020 Recommended Consults -- Oral Care Recommendations Oral care BID Other Recommendations --   CHL IP FOLLOW UP RECOMMENDATIONS 11/24/2020 Follow up Recommendations  None   No flowsheet data found.     CHL IP ORAL PHASE 11/24/2020  Oral Phase WFL Oral - Pudding Teaspoon -- Oral - Pudding Cup -- Oral - Honey Teaspoon -- Oral - Honey Cup -- Oral - Nectar Teaspoon -- Oral - Nectar Cup -- Oral - Nectar Straw -- Oral - Thin Teaspoon -- Oral - Thin Cup -- Oral - Thin Straw -- Oral - Puree -- Oral - Mech Soft -- Oral - Regular -- Oral - Multi-Consistency -- Oral - Pill -- Oral Phase - Comment --  CHL IP PHARYNGEAL PHASE 11/24/2020 Pharyngeal Phase Impaired Pharyngeal- Pudding Teaspoon -- Pharyngeal -- Pharyngeal- Pudding Cup -- Pharyngeal -- Pharyngeal- Honey Teaspoon -- Pharyngeal -- Pharyngeal- Honey Cup -- Pharyngeal -- Pharyngeal- Nectar Teaspoon -- Pharyngeal -- Pharyngeal- Nectar Cup -- Pharyngeal -- Pharyngeal- Nectar Straw -- Pharyngeal -- Pharyngeal- Thin Teaspoon WFL Pharyngeal Material does not enter airway Pharyngeal- Thin Cup WFL Pharyngeal -- Pharyngeal- Thin Straw Penetration/Aspiration during swallow Pharyngeal Material enters airway, remains ABOVE vocal cords then ejected out Pharyngeal- Puree -- Pharyngeal -- Pharyngeal- Mechanical Soft -- Pharyngeal -- Pharyngeal- Regular -- Pharyngeal -- Pharyngeal- Multi-consistency -- Pharyngeal -- Pharyngeal- Pill -- Pharyngeal -- Pharyngeal Comment age appropriate  CHL IP CERVICAL ESOPHAGEAL PHASE 11/24/2020 Cervical Esophageal Phase WFL Pudding Teaspoon -- Pudding Cup -- Honey Teaspoon -- Honey Cup -- Nectar Teaspoon -- Nectar Cup -- Nectar Straw -- Thin Teaspoon -- Thin Cup -- Thin Straw -- Puree -- Mechanical Soft -- Regular -- Multi-consistency -- Pill -- Cervical Esophageal Comment -- McCoy Leah Meryl 11/24/2020, 11:28 AM              ECHOCARDIOGRAM COMPLETE  Result Date: 11/24/2020    ECHOCARDIOGRAM REPORT   Patient Name:   SHANESSA LIEBLING Date of Exam: 11/24/2020 Medical Rec #:  [id number]        Height:       65.0 in Accession #:    [id number]       Weight:       158.0 lb Date of Birth:  06-30-1932        BSA:          1.790 m Patient Age:    89 years         BP:           97/45 mmHg  Patient Gender: F                HR:           86 bpm. Exam Location:  Inpatient Procedure: 2D Echo Indications:    abnormal ecg  History:        Patient has no prior history of Echocardiogram examinations.                 CAD, emphysema; Risk Factors:Hypertension.  Sonographer:    Johny Chess RDCS Referring Phys: [id number] [place]  1. Left ventricular ejection fraction, by estimation, is 65 to 70%. The left ventricle has normal function. The left ventricle has no regional wall motion abnormalities. Left ventricular diastolic parameters are consistent with Grade I diastolic dysfunction (impaired relaxation).  2. Right ventricular systolic function is normal. The right ventricular size is normal. There is mildly elevated pulmonary artery systolic pressure. The estimated right ventricular systolic pressure is 0000000 mmHg.  3. The mitral valve is degenerative. No evidence of mitral valve regurgitation. No evidence of mitral stenosis. Moderate mitral annular calcification.  4. The aortic valve is tricuspid. There  is moderate calcification of the aortic valve. There is moderate thickening of the aortic valve. Aortic valve regurgitation is not visualized. Mild to moderate aortic valve sclerosis/calcification is present, without any evidence of aortic stenosis.  5. The inferior vena cava is normal in size with greater than 50% respiratory variability, suggesting right atrial pressure of 3 mmHg. FINDINGS  Left Ventricle: Left ventricular ejection fraction, by estimation, is 65 to 70%. The left ventricle has normal function. The left ventricle has no regional wall motion abnormalities. The left ventricular internal cavity size was normal in size. There is  no left ventricular hypertrophy. Left ventricular diastolic parameters are consistent with Grade I diastolic dysfunction (impaired relaxation). Right Ventricle: The right ventricular size is normal. No increase in right ventricular wall thickness.  Right ventricular systolic function is normal. There is mildly elevated pulmonary artery systolic pressure. The tricuspid regurgitant velocity is 2.88  m/s, and with an assumed right atrial pressure of 3 mmHg, the estimated right ventricular systolic pressure is 0000000 mmHg. Left Atrium: Left atrial size was normal in size. Right Atrium: Right atrial size was normal in size. Pericardium: Trivial pericardial effusion is present. Mitral Valve: The mitral valve is degenerative in appearance. Moderate mitral annular calcification. No evidence of mitral valve regurgitation. No evidence of mitral valve stenosis. Tricuspid Valve: The tricuspid valve is grossly normal. Tricuspid valve regurgitation is trivial. No evidence of tricuspid stenosis. Aortic Valve: The aortic valve is tricuspid. There is moderate calcification of the aortic valve. There is moderate thickening of the aortic valve. Aortic valve regurgitation is not visualized. Mild to moderate aortic valve sclerosis/calcification is present, without any evidence of aortic stenosis. Pulmonic Valve: The pulmonic valve was grossly normal. Pulmonic valve regurgitation is not visualized. No evidence of pulmonic stenosis. Aorta: The aortic root and ascending aorta are structurally normal, with no evidence of dilitation. Venous: The right lower pulmonary vein is normal. The inferior vena cava is normal in size with greater than 50% respiratory variability, suggesting right atrial pressure of 3 mmHg. IAS/Shunts: The atrial septum is grossly normal.  LEFT VENTRICLE PLAX 2D LVIDd:         4.10 cm   Diastology LVIDs:         2.20 cm   LV e' medial:    5.77 cm/s LV PW:         0.90 cm   LV E/e' medial:  16.2 LV IVS:        1.00 cm   LV e' lateral:   7.94 cm/s LVOT diam:     1.90 cm   LV E/e' lateral: 11.8 LV SV:         65 LV SV Index:   37 LVOT Area:     2.84 cm  RIGHT VENTRICLE             IVC RV S prime:     13.10 cm/s  IVC diam: 1.70 cm TAPSE (M-mode): 2.5 cm LEFT ATRIUM              Index        RIGHT ATRIUM           Index LA diam:        3.00 cm 1.68 cm/m   RA Area:     11.10 cm LA Vol (A2C):   43.4 ml 24.25 ml/m  RA Volume:   21.90 ml  12.24 ml/m LA Vol (A4C):   39.9 ml 22.30 ml/m LA Biplane Vol: 44.8 ml 25.03 ml/m  AORTIC VALVE LVOT Vmax:   124.00 cm/s LVOT Vmean:  82.500 cm/s LVOT VTI:    0.231 m  AORTA Ao Root diam: 2.90 cm Ao Asc diam:  3.00 cm MITRAL VALVE                TRICUSPID VALVE MV Area (PHT): 2.63 cm     TR Peak grad:   33.2 mmHg MV Decel Time: 288 msec     TR Vmax:        288.00 cm/s MV E velocity: 93.40 cm/s MV A velocity: 148.00 cm/s  SHUNTS MV E/A ratio:  0.63         Systemic VTI:  0.23 m                             Systemic Diam: 1.90 cm Eleonore Chiquito MD Electronically signed by Eleonore Chiquito MD Signature Date/Time: 11/24/2020/5:44:43 PM    Final      Subjective: Patient is feeling better, no nausea or vomiting, dyspnea has improved.  Discharge Exam: Vitals:   11/25/20 2141 11/26/20 0536  BP: (!) 139/56 (!) 114/40  Pulse:    Resp:    Temp:  98.8 F (37.1 C)  SpO2: 96%    Vitals:   11/25/20 1928 11/25/20 2100 11/25/20 2141 11/26/20 0536  BP:   (!) 139/56 (!) 114/40  Pulse:      Resp:      Temp:  97.6 F (36.4 C)  98.8 F (37.1 C)  TempSrc:  Oral  Oral  SpO2: 95%  96%   Weight:      Height:        General: Not in pain or dyspnea  Neurology: Awake and alert, non focal  E ENT: no pallor, no icterus, oral mucosa moist Cardiovascular: No JVD. S1-S2 present, rhythmic, no gallops, rubs, or murmurs. No lower extremity edema. Pulmonary: positive breath sounds bilaterally, adequate air movement, no wheezing, rhonchi or rales. Gastrointestinal. Abdomen soft and non tender Skin. No rashes Musculoskeletal: no joint deformities   The results of significant diagnostics from this hospitalization (including imaging, microbiology, ancillary and laboratory) are listed below for reference.     Microbiology: Recent Results (from the  past 240 hour(s))  Blood culture (routine x 2)     Status: None (Preliminary result)   Collection Time: 11/22/20  6:00 PM   Specimen: Right Antecubital; Blood  Result Value Ref Range Status   Specimen Description   Final    RIGHT ANTECUBITAL Performed at [facility], [address] [place], [place] [address]    Special Requests   Final    BOTTLES DRAWN AEROBIC AND ANAEROBIC Blood Culture adequate volume Performed at [facility], [address] [place], [place] [address]    Culture   Final    NO GROWTH 2 DAYS Performed at [facility], [place] [address], [place], [place] [address]    Report Status PENDING  Incomplete  Resp Panel by RT-PCR (Flu A&B, Covid) Nasopharyngeal Swab     Status: None   Collection Time: 11/22/20  6:12 PM   Specimen: Nasopharyngeal Swab; Nasopharyngeal(NP) swabs in vial transport medium  Result Value Ref Range Status   SARS Coronavirus 2 by RT PCR NEGATIVE NEGATIVE Final    Comment: (NOTE) SARS-CoV-2 target nucleic acids are NOT DETECTED.  The SARS-CoV-2 RNA is generally detectable in upper respiratory specimens during the acute phase of infection. The  lowest concentration of SARS-CoV-2 viral copies this assay can detect is 138 copies/mL. A negative result does not preclude SARS-Cov-2 infection and should not be used as the sole basis for treatment or other patient management decisions. A negative result may occur with  improper specimen collection/handling, submission of specimen other than nasopharyngeal swab, presence of viral mutation(s) within the areas targeted by this assay, and inadequate number of viral copies(<138 copies/mL). A negative result must be combined with clinical observations, patient history, and epidemiological information. The expected result is Negative.  Fact Sheet for Patients:  EntrepreneurPulse.com.au  Fact Sheet for Healthcare Providers:   IncredibleEmployment.be  This test is no t yet approved or cleared by the [place] FDA and  has been authorized for detection and/or diagnosis of SARS-CoV-2 by FDA under an Emergency Use Authorization (EUA). This EUA will remain  in effect (meaning this test can be used) for the duration of the COVID-19 declaration under Section 564(b)(1) of the Act, 21 U.S.C.section 360bbb-3(b)(1), unless the authorization is terminated  or revoked sooner.       Influenza A by PCR NEGATIVE NEGATIVE Final   Influenza B by PCR NEGATIVE NEGATIVE Final    Comment: (NOTE) The Xpert Xpress SARS-CoV-2/FLU/RSV plus assay is intended as an aid in the diagnosis of influenza from Nasopharyngeal swab specimens and should not be used as a sole basis for treatment. Nasal washings and aspirates are unacceptable for Xpert Xpress SARS-CoV-2/FLU/RSV testing.  Fact Sheet for Patients: EntrepreneurPulse.com.au  Fact Sheet for Healthcare Providers: IncredibleEmployment.be  This test is not yet approved or cleared by the [place] FDA and has been authorized for detection and/or diagnosis of SARS-CoV-2 by FDA under an Emergency Use Authorization (EUA). This EUA will remain in effect (meaning this test can be used) for the duration of the COVID-19 declaration under Section 564(b)(1) of the Act, 21 U.S.C. section 360bbb-3(b)(1), unless the authorization is terminated or revoked.  Performed at [facility], [address] [place], [place] [address]   Blood culture (routine x 2)     Status: None (Preliminary result)   Collection Time: 11/22/20  6:20 PM   Specimen: BLOOD RIGHT WRIST  Result Value Ref Range Status   Specimen Description   Final    BLOOD RIGHT WRIST Performed at [facility], [address] [place], [place] [address]    Special Requests   Final    BOTTLES DRAWN AEROBIC AND ANAEROBIC Blood Culture adequate  volume Performed at [facility], [address] [place], [place] [address]    Culture   Final    NO GROWTH 2 DAYS Performed at [facility], [place] [address] [place], [place] [address]    Report Status PENDING  Incomplete     Labs: BNP (last 3 results) No results for input(s): BNP in the last 8760 hours. Basic Metabolic Panel: Recent Labs  Lab 11/22/20 1537 11/22/20 1809 11/23/20 1159 11/25/20 0430 11/26/20 0333  NA 133* 133* 137 134* 134*  K 3.9 4.0 3.5 3.4* 3.9  CL 101  --  106 104 106  CO2 23  --  24 21* 21*  GLUCOSE 95  --  100* 97 97  BUN 10  --  6* 12 11  CREATININE 0.71  --  0.61 0.74 0.69  CALCIUM 10.0  --  9.6 9.5 9.5   Liver Function Tests: No results for input(s): AST, ALT, ALKPHOS, BILITOT, PROT, ALBUMIN in the last 168 hours.  No results for input(s): LIPASE, AMYLASE in the last 168 hours. No results for input(s): AMMONIA in the last 168 hours. CBC: Recent Labs  Lab 11/22/20 1537 11/22/20 1809 11/24/20 0759 11/25/20 0430 11/26/20 0333  WBC 16.6*  --  14.8* 13.5* 10.3  HGB 13.8 13.9 12.6 12.3 12.7  HCT 41.6 41.0 37.5 37.2 39.2  MCV 89.3  --  89.1 87.9 89.3  PLT 309  --  271 339 310   Cardiac Enzymes: No results for input(s): CKTOTAL, CKMB, CKMBINDEX, TROPONINI in the last 168 hours. BNP: Invalid input(s): POCBNP CBG: No results for input(s): GLUCAP in the last 168 hours. D-Dimer No results for input(s): DDIMER in the last 72 hours. Hgb A1c No results for input(s): HGBA1C in the last 72 hours. Lipid Profile No results for input(s): CHOL, HDL, LDLCALC, TRIG, CHOLHDL, LDLDIRECT in the last 72 hours. Thyroid function studies No results for input(s): TSH, T4TOTAL, T3FREE, THYROIDAB in the last 72 hours.  Invalid input(s): FREET3 Anemia work up No results for input(s): VITAMINB12, FOLATE, FERRITIN, TIBC, IRON, RETICCTPCT in the last 72 hours. Urinalysis    Component Value Date/Time   COLORURINE YELLOW 07/21/2020 1613    APPEARANCEUR CLOUDY (A) 07/21/2020 1613   LABSPEC >1.030 (H) 07/21/2020 1613   PHURINE 5.5 07/21/2020 1613   GLUCOSEU NEGATIVE 07/21/2020 1613   HGBUR NEGATIVE 07/21/2020 1613   BILIRUBINUR NEGATIVE 07/21/2020 1613   KETONESUR NEGATIVE 07/21/2020 1613   PROTEINUR NEGATIVE 07/21/2020 1613   NITRITE NEGATIVE 07/21/2020 1613   LEUKOCYTESUR NEGATIVE 07/21/2020 1613   Sepsis Labs Invalid input(s): PROCALCITONIN,  WBC,  LACTICIDVEN Microbiology Recent Results (from the past 240 hour(s))  Blood culture (routine x 2)     Status: None (Preliminary result)   Collection Time: 11/22/20  6:00 PM   Specimen: Right Antecubital; Blood  Result Value Ref Range Status   Specimen Description   Final    RIGHT ANTECUBITAL Performed at [facility], [address] [place], [place] [address]    Special Requests   Final    BOTTLES DRAWN AEROBIC AND ANAEROBIC Blood Culture adequate volume Performed at [facility], [address] [place], [place] [address]    Culture   Final    NO GROWTH 2 DAYS Performed at [facility], [place] [address] [place], [place] [address]    Report Status PENDING  Incomplete  Resp Panel by RT-PCR (Flu A&B, Covid) Nasopharyngeal Swab     Status: None   Collection Time: 11/22/20  6:12 PM   Specimen: Nasopharyngeal Swab; Nasopharyngeal(NP) swabs in vial transport medium  Result Value Ref Range Status   SARS Coronavirus 2 by RT PCR NEGATIVE NEGATIVE Final    Comment: (NOTE) SARS-CoV-2 target nucleic acids are NOT DETECTED.  The SARS-CoV-2 RNA is generally detectable in upper respiratory specimens during the acute phase of infection. The lowest concentration of SARS-CoV-2 viral copies this assay can detect is 138 copies/mL. A negative result does not preclude SARS-Cov-2 infection and should not be used as the sole basis for treatment or other patient management decisions. A negative result may occur with  improper specimen collection/handling,  submission of specimen other than nasopharyngeal swab, presence of viral mutation(s) within the areas targeted by this assay, and inadequate number of viral copies(<138 copies/mL). A negative result must be combined with clinical observations, patient history, and epidemiological information. The expected result is Negative.  Fact Sheet for Patients:  EntrepreneurPulse.com.au  Fact Sheet for Healthcare Providers:  IncredibleEmployment.be  This test is no t yet approved or cleared by the [place] and  has been authorized for detection and/or diagnosis of SARS-CoV-2 by FDA under an Emergency Use Authorization (EUA). This EUA will remain  in effect (meaning this test can be used) for the duration of the COVID-19 declaration under Section 564(b)(1) of the Act, 21 U.S.C.section 360bbb-3(b)(1), unless the authorization is terminated  or revoked sooner.       Influenza A by PCR NEGATIVE NEGATIVE Final   Influenza B by PCR NEGATIVE NEGATIVE Final    Comment: (NOTE) The Xpert Xpress SARS-CoV-2/FLU/RSV plus assay is intended as an aid in the diagnosis of influenza from Nasopharyngeal swab specimens and should not be used as a sole basis for treatment. Nasal washings and aspirates are unacceptable for Xpert Xpress SARS-CoV-2/FLU/RSV testing.  Fact Sheet for Patients: EntrepreneurPulse.com.au  Fact Sheet for Healthcare Providers: IncredibleEmployment.be  This test is not yet approved or cleared by the [place] FDA and has been authorized for detection and/or diagnosis of SARS-CoV-2 by FDA under an Emergency Use Authorization (EUA). This EUA will remain in effect (meaning this test can be used) for the duration of the COVID-19 declaration under Section 564(b)(1) of the Act, 21 U.S.C. section 360bbb-3(b)(1), unless the authorization is terminated or revoked.  Performed at [facility], [address] [place], [place] [address]   Blood culture (routine x 2)     Status: None (Preliminary result)   Collection Time: 11/22/20  6:20 PM   Specimen: BLOOD RIGHT WRIST  Result Value Ref Range Status   Specimen Description   Final    BLOOD RIGHT WRIST Performed at [facility], [address] [place], [place] [address]    Special Requests   Final    BOTTLES DRAWN AEROBIC AND ANAEROBIC Blood Culture adequate volume Performed at [facility], [address] [place], [place] [address]    Culture   Final    NO GROWTH 2 DAYS Performed at [facility], [place] [address] [place], [place] [address]    Report Status PENDING  Incomplete     Time coordinating discharge: 45 minutes  SIGNED:   Tawni Millers, MD  [facility] 11/26/2020, 9:14 AM

## 2020-11-26 NOTE — TOC Initial Note (Signed)
Transition of Care [facility]) - Initial/Assessment Note    Patient Details  Name: Tina Bishop MRN: [id number] Date of Birth: Dec 25, 1932  Transition [facility] Care [facility]) CM/SW Contact:    Lawerance Sabal, RN Phone Number: 11/26/2020, 10:28 AM  Clinical Narrative:       Sherron Monday w patient's granddaughter briefly over the phone. 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 She states that she will call me back when she has a break.  Her shiift is over at 3pm and she will be coming to the hospital at that time to see if she feels like she will be able to take her grandmother home.                Barriers to Discharge: No Barriers Identified   Patient Goals and CMS Choice        Expected Discharge Plan and Services                                                Prior Living Arrangements/Services                       Activities of Daily Living Home Assistive Devices/Equipment: None ADL Screening (condition at time of admission) Patient's cognitive ability adequate to safely complete daily activities?: Yes Is the patient deaf or have difficulty hearing?: Yes Does the patient have difficulty seeing, even when wearing glasses/contacts?: No Does the patient have difficulty concentrating, remembering, or making decisions?: No Patient able to express need for assistance with ADLs?: Yes Does the patient have difficulty dressing or bathing?: Yes Independently performs ADLs?: Yes (appropriate for developmental age) Does the patient have difficulty walking or climbing stairs?: Yes Weakness of Legs: Both Weakness of Arms/Hands: Both  Permission Sought/Granted                  Emotional Assessment              Admission diagnosis:  CAP (community acquired pneumonia) [J18.9] Community acquired pneumonia of right lung, unspecified part of lung [J18.9] Patient Active Problem List   Diagnosis Date Noted   CAD (coronary artery disease)  11/23/2020   HTN (hypertension) 11/23/2020   Emphysema lung (HCC) 11/23/2020   Hypothyroidism 11/23/2020   Rheumatoid arthritis (HCC) 11/23/2020   CAP (community acquired pneumonia) 11/22/2020   Convulsions/seizures (HCC) 08/10/2012   Mild cognitive impairment 08/10/2012   Pulmonary fibrosis (HCC) 02/26/2012   PCP:  Heron Nay, PA Pharmacy:   [id number] [place], [place] - [address] AT [facility] OF [facility] & [address] [place] [address] Phone: [phone] Fax: [phone]  [facility] #[id number] - [place], [place] - [address] AT [facility] OF [address] & [address] [facility] [address] [facility] [address] [place] [address] Phone: [phone] Fax: [phone]     Social Determinants of Health (SDOH) Interventions    Readmission Risk Interventions No flowsheet data found.

## 2020-11-26 NOTE — Progress Notes (Signed)
PT Cancellation Note  Patient Details Name: Tina Bishop MRN: [id number] DOB: Nov 23, 1932   Cancelled Treatment:    Reason Eval/Treat Not Completed: Patient declined, no reason specified. Pt reports she is not going to get up or walk until her granddaughter gets here to take her home. PT will follow up as time allows.   Arlyss Gandy 11/26/2020, 2:53 PM

## 2020-11-26 NOTE — Progress Notes (Signed)
AVS provided and reviewed with pt. All questions answered at this time. Pt discharged home with granddaughter .

## 2020-11-26 NOTE — TOC Transition Note (Signed)
Transition of Care [facility]) - CM/SW Discharge Note   Patient Details  Name: Tina Bishop MRN: [id number] Date of Birth: 1932-12-28  Transition of Care [facility]) CM/SW Contact:  Lawerance Sabal, RN Phone Number: 11/26/2020, 3:47 PM   Clinical Narrative:   Sherron Monday w patient's granddaughter, [name]. She states that she is on the way to the hospital to visit patient. Discussed medical readiness, patient stable for DC and home needs. [name] confirms that patient will DC to address on file with her son and daughter in law. RW ordered and will be delivered to room shortly. HH services have been set up with [facility]. No other DC needs identified.    Tefft,Jessica Granddaughter   [phone]       Final next level of care: Home w Home Health Services Barriers to Discharge: No Barriers Identified   Patient Goals and CMS Choice Patient states their goals for this hospitalization and ongoing recovery are:: return home per granddaughter CMS Medicare.gov Compare Post Acute Care list provided to:: Other (Comment Required)    Discharge Placement                       Discharge Plan and Services                DME Arranged: Walker rolling DME Agency: [employer] Date DME Agency Contacted: 11/26/20 Time DME Agency Contacted: [phone] Representative spoke with at DME Agency: Leavy Cella HH Arranged: PT HH Agency: [facility] Date [facility] Agency Contacted: 11/26/20 Time HH Agency Contacted: [phone] Representative spoke with at [facility] Agency: Kandee Keen  Social Determinants of Health (SDOH) Interventions     Readmission Risk Interventions No flowsheet data found.

## 2020-11-28 LAB — CULTURE, BLOOD (ROUTINE X 2)
Culture: NO GROWTH
Culture: NO GROWTH
Special Requests: ADEQUATE
Special Requests: ADEQUATE

## 2023-10-09 ENCOUNTER — Other Ambulatory Visit (HOSPITAL_COMMUNITY): Payer: Self-pay
